# Patient Record
Sex: Female | Born: 1985
Health system: Southern US, Community
[De-identification: ages and names within clinical notes are randomized; demographics above are authoritative.]

## PROBLEM LIST (undated history)

## (undated) DIAGNOSIS — Z789 Other specified health status: Secondary | ICD-10-CM

## (undated) HISTORY — PX: OVARIAN CYST DRAINAGE: SHX325

## (undated) HISTORY — PX: OTHER SURGICAL HISTORY: SHX169

---
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
## 2005-02-01 ENCOUNTER — Emergency Department: Payer: Self-pay | Admitting: Emergency Medicine

## 2005-02-02 ENCOUNTER — Ambulatory Visit: Payer: Self-pay | Admitting: Emergency Medicine

## 2005-02-08 ENCOUNTER — Emergency Department: Payer: Self-pay | Admitting: Emergency Medicine

## 2005-02-09 ENCOUNTER — Emergency Department (HOSPITAL_COMMUNITY): Admission: EM | Admit: 2005-02-09 | Discharge: 2005-02-10 | Payer: Self-pay | Admitting: Emergency Medicine

## 2005-02-12 ENCOUNTER — Emergency Department (HOSPITAL_COMMUNITY): Admission: EM | Admit: 2005-02-12 | Discharge: 2005-02-12 | Payer: Self-pay | Admitting: *Deleted

## 2005-03-06 ENCOUNTER — Emergency Department (HOSPITAL_COMMUNITY): Admission: EM | Admit: 2005-03-06 | Discharge: 2005-03-06 | Payer: Self-pay | Admitting: *Deleted

## 2005-03-10 ENCOUNTER — Emergency Department: Payer: Self-pay | Admitting: Emergency Medicine

## 2005-08-05 ENCOUNTER — Emergency Department: Payer: Self-pay | Admitting: Emergency Medicine

## 2005-08-05 ENCOUNTER — Emergency Department (HOSPITAL_COMMUNITY): Admission: EM | Admit: 2005-08-05 | Discharge: 2005-08-05 | Payer: Self-pay | Admitting: Emergency Medicine

## 2005-08-24 ENCOUNTER — Emergency Department (HOSPITAL_COMMUNITY): Admission: EM | Admit: 2005-08-24 | Discharge: 2005-08-25 | Payer: Self-pay | Admitting: Emergency Medicine

## 2005-08-26 ENCOUNTER — Emergency Department (HOSPITAL_COMMUNITY): Admission: EM | Admit: 2005-08-26 | Discharge: 2005-08-26 | Payer: Self-pay | Admitting: Emergency Medicine

## 2005-09-02 ENCOUNTER — Emergency Department (HOSPITAL_COMMUNITY): Admission: EM | Admit: 2005-09-02 | Discharge: 2005-09-02 | Payer: Self-pay | Admitting: Emergency Medicine

## 2005-09-08 ENCOUNTER — Emergency Department (HOSPITAL_COMMUNITY): Admission: AD | Admit: 2005-09-08 | Discharge: 2005-09-08 | Payer: Self-pay | Admitting: Family Medicine

## 2005-09-14 ENCOUNTER — Emergency Department (HOSPITAL_COMMUNITY): Admission: EM | Admit: 2005-09-14 | Discharge: 2005-09-14 | Payer: Self-pay | Admitting: Emergency Medicine

## 2005-10-12 ENCOUNTER — Emergency Department: Payer: Self-pay | Admitting: Emergency Medicine

## 2006-03-09 ENCOUNTER — Emergency Department (HOSPITAL_COMMUNITY): Admission: EM | Admit: 2006-03-09 | Discharge: 2006-03-10 | Payer: Self-pay | Admitting: Emergency Medicine

## 2006-06-15 ENCOUNTER — Emergency Department: Payer: Self-pay | Admitting: Emergency Medicine

## 2006-07-20 ENCOUNTER — Emergency Department (HOSPITAL_COMMUNITY): Admission: EM | Admit: 2006-07-20 | Discharge: 2006-07-20 | Payer: Self-pay | Admitting: Emergency Medicine

## 2006-08-16 ENCOUNTER — Emergency Department (HOSPITAL_COMMUNITY): Admission: EM | Admit: 2006-08-16 | Discharge: 2006-08-16 | Payer: Self-pay | Admitting: Emergency Medicine

## 2006-09-07 ENCOUNTER — Emergency Department (HOSPITAL_COMMUNITY): Admission: EM | Admit: 2006-09-07 | Discharge: 2006-09-07 | Payer: Self-pay | Admitting: Emergency Medicine

## 2006-09-22 ENCOUNTER — Inpatient Hospital Stay (HOSPITAL_COMMUNITY): Admission: EM | Admit: 2006-09-22 | Discharge: 2006-09-23 | Payer: Self-pay | Admitting: Emergency Medicine

## 2006-12-02 ENCOUNTER — Emergency Department (HOSPITAL_COMMUNITY): Admission: EM | Admit: 2006-12-02 | Discharge: 2006-12-02 | Payer: Self-pay | Admitting: Emergency Medicine

## 2007-01-05 ENCOUNTER — Emergency Department (HOSPITAL_COMMUNITY): Admission: EM | Admit: 2007-01-05 | Discharge: 2007-01-05 | Payer: Self-pay | Admitting: *Deleted

## 2007-02-24 ENCOUNTER — Emergency Department (HOSPITAL_COMMUNITY): Admission: EM | Admit: 2007-02-24 | Discharge: 2007-02-24 | Payer: Self-pay | Admitting: Emergency Medicine

## 2007-03-20 ENCOUNTER — Emergency Department (HOSPITAL_COMMUNITY): Admission: EM | Admit: 2007-03-20 | Discharge: 2007-03-20 | Payer: Self-pay | Admitting: Emergency Medicine

## 2007-04-22 ENCOUNTER — Emergency Department (HOSPITAL_COMMUNITY): Admission: EM | Admit: 2007-04-22 | Discharge: 2007-04-22 | Payer: Self-pay | Admitting: Emergency Medicine

## 2007-07-02 ENCOUNTER — Emergency Department: Payer: Self-pay | Admitting: Internal Medicine

## 2007-08-21 ENCOUNTER — Emergency Department: Payer: Self-pay | Admitting: Emergency Medicine

## 2007-10-08 ENCOUNTER — Emergency Department (HOSPITAL_COMMUNITY): Admission: EM | Admit: 2007-10-08 | Discharge: 2007-10-08 | Payer: Self-pay | Admitting: Emergency Medicine

## 2007-10-19 ENCOUNTER — Emergency Department: Payer: Self-pay | Admitting: Emergency Medicine

## 2007-12-09 ENCOUNTER — Other Ambulatory Visit: Payer: Self-pay

## 2007-12-09 ENCOUNTER — Observation Stay: Payer: Self-pay | Admitting: Internal Medicine

## 2008-02-23 ENCOUNTER — Emergency Department: Payer: Self-pay | Admitting: Emergency Medicine

## 2008-05-10 ENCOUNTER — Emergency Department (HOSPITAL_COMMUNITY): Admission: EM | Admit: 2008-05-10 | Discharge: 2008-05-10 | Payer: Self-pay | Admitting: Emergency Medicine

## 2008-06-17 ENCOUNTER — Emergency Department (HOSPITAL_COMMUNITY): Admission: EM | Admit: 2008-06-17 | Discharge: 2008-06-18 | Payer: Self-pay | Admitting: Emergency Medicine

## 2008-08-17 ENCOUNTER — Emergency Department (HOSPITAL_COMMUNITY): Admission: EM | Admit: 2008-08-17 | Discharge: 2008-08-17 | Payer: Self-pay | Admitting: Emergency Medicine

## 2008-08-26 ENCOUNTER — Ambulatory Visit (HOSPITAL_COMMUNITY): Admission: RE | Admit: 2008-08-26 | Discharge: 2008-08-26 | Payer: Self-pay | Admitting: Orthopedic Surgery

## 2008-09-29 ENCOUNTER — Emergency Department (HOSPITAL_COMMUNITY): Admission: EM | Admit: 2008-09-29 | Discharge: 2008-09-29 | Payer: Self-pay | Admitting: Emergency Medicine

## 2008-10-09 ENCOUNTER — Emergency Department (HOSPITAL_COMMUNITY): Admission: EM | Admit: 2008-10-09 | Discharge: 2008-10-09 | Payer: Self-pay | Admitting: Emergency Medicine

## 2008-12-13 ENCOUNTER — Emergency Department (HOSPITAL_COMMUNITY): Admission: EM | Admit: 2008-12-13 | Discharge: 2008-12-13 | Payer: Self-pay | Admitting: Emergency Medicine

## 2009-02-16 ENCOUNTER — Emergency Department (HOSPITAL_COMMUNITY): Admission: EM | Admit: 2009-02-16 | Discharge: 2009-02-16 | Payer: Self-pay | Admitting: Emergency Medicine

## 2009-02-21 ENCOUNTER — Emergency Department (HOSPITAL_COMMUNITY): Admission: EM | Admit: 2009-02-21 | Discharge: 2009-02-22 | Payer: Self-pay | Admitting: Emergency Medicine

## 2009-02-27 ENCOUNTER — Emergency Department: Payer: Self-pay | Admitting: Emergency Medicine

## 2009-03-04 ENCOUNTER — Emergency Department: Payer: Self-pay | Admitting: Unknown Physician Specialty

## 2009-03-08 ENCOUNTER — Emergency Department (HOSPITAL_COMMUNITY): Admission: EM | Admit: 2009-03-08 | Discharge: 2009-03-08 | Payer: Self-pay | Admitting: Emergency Medicine

## 2009-03-14 ENCOUNTER — Emergency Department (HOSPITAL_BASED_OUTPATIENT_CLINIC_OR_DEPARTMENT_OTHER): Admission: EM | Admit: 2009-03-14 | Discharge: 2009-03-14 | Payer: Self-pay | Admitting: Emergency Medicine

## 2009-03-14 ENCOUNTER — Ambulatory Visit: Payer: Self-pay | Admitting: Diagnostic Radiology

## 2009-03-22 ENCOUNTER — Ambulatory Visit: Payer: Self-pay | Admitting: Diagnostic Radiology

## 2009-03-22 ENCOUNTER — Emergency Department (HOSPITAL_BASED_OUTPATIENT_CLINIC_OR_DEPARTMENT_OTHER): Admission: EM | Admit: 2009-03-22 | Discharge: 2009-03-22 | Payer: Self-pay | Admitting: Emergency Medicine

## 2009-05-06 ENCOUNTER — Emergency Department (HOSPITAL_COMMUNITY): Admission: EM | Admit: 2009-05-06 | Discharge: 2009-05-06 | Payer: Self-pay | Admitting: Emergency Medicine

## 2009-05-23 ENCOUNTER — Emergency Department (HOSPITAL_COMMUNITY): Admission: EM | Admit: 2009-05-23 | Discharge: 2009-05-23 | Payer: Self-pay | Admitting: Emergency Medicine

## 2009-07-03 ENCOUNTER — Inpatient Hospital Stay (HOSPITAL_COMMUNITY): Admission: AD | Admit: 2009-07-03 | Discharge: 2009-07-03 | Payer: Self-pay | Admitting: Obstetrics & Gynecology

## 2009-07-06 ENCOUNTER — Emergency Department (HOSPITAL_BASED_OUTPATIENT_CLINIC_OR_DEPARTMENT_OTHER): Admission: EM | Admit: 2009-07-06 | Discharge: 2009-07-06 | Payer: Self-pay | Admitting: Emergency Medicine

## 2009-07-12 IMAGING — CR DG TOE GREAT 2+V*R*
3 series · 3 of 3 positions shown · non-contrast
Comparison: 

CLINICAL DATA: Right great toe pain, fell

RIGHT TOE - 2+ VIEW

[t toes ap right]
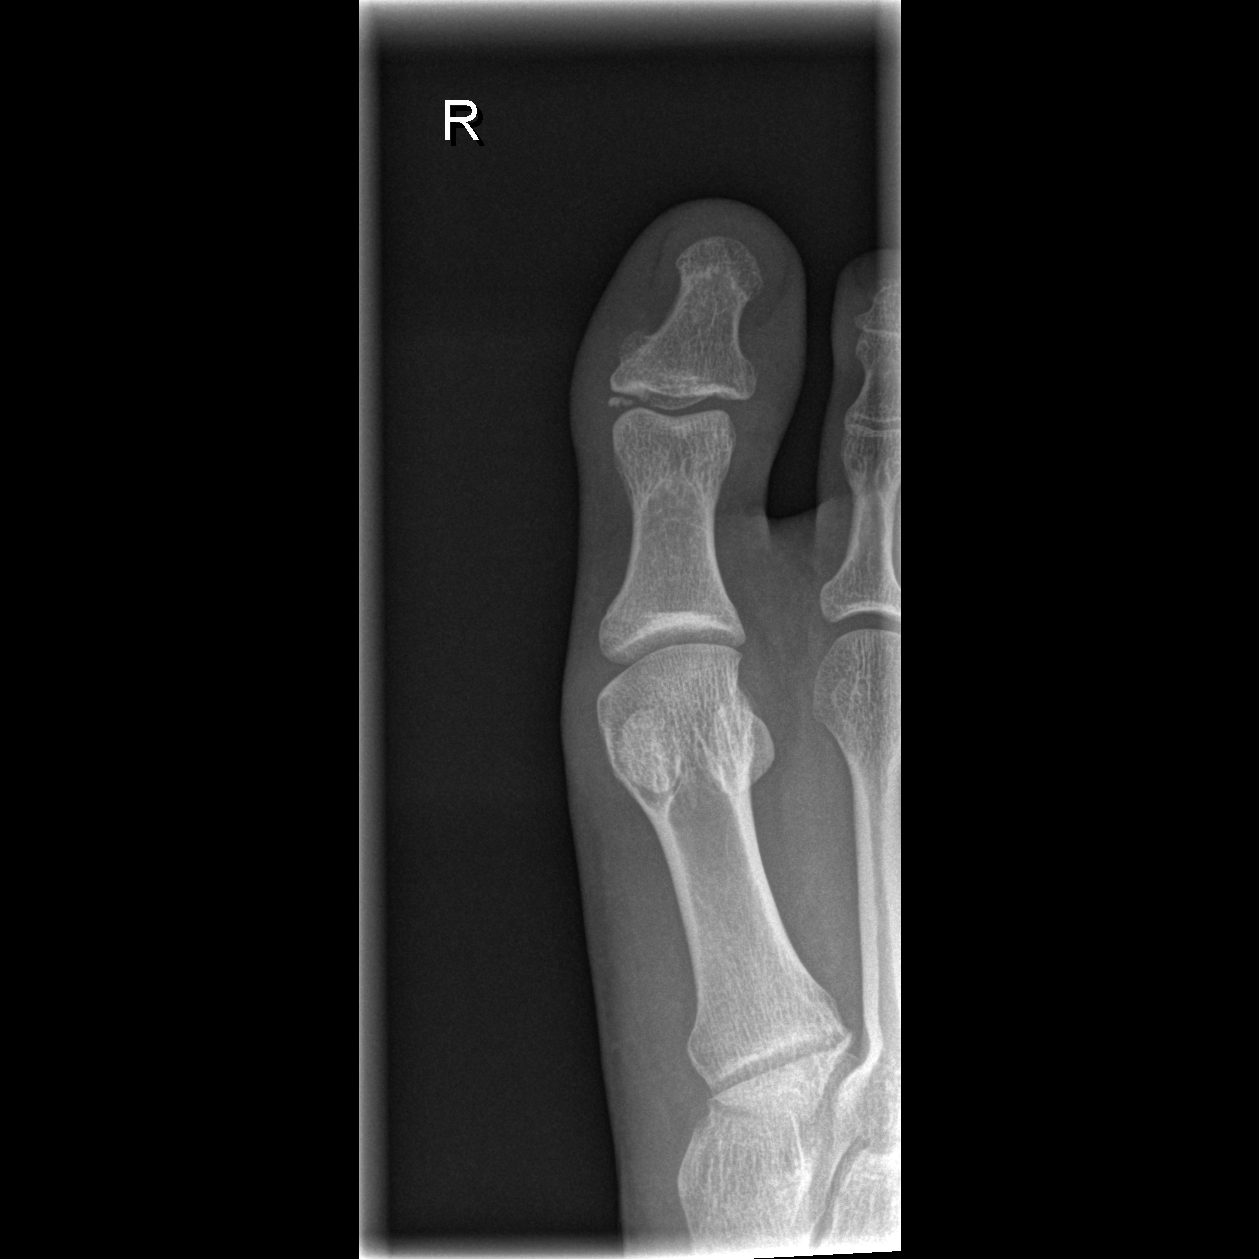

[t toes oblique right]
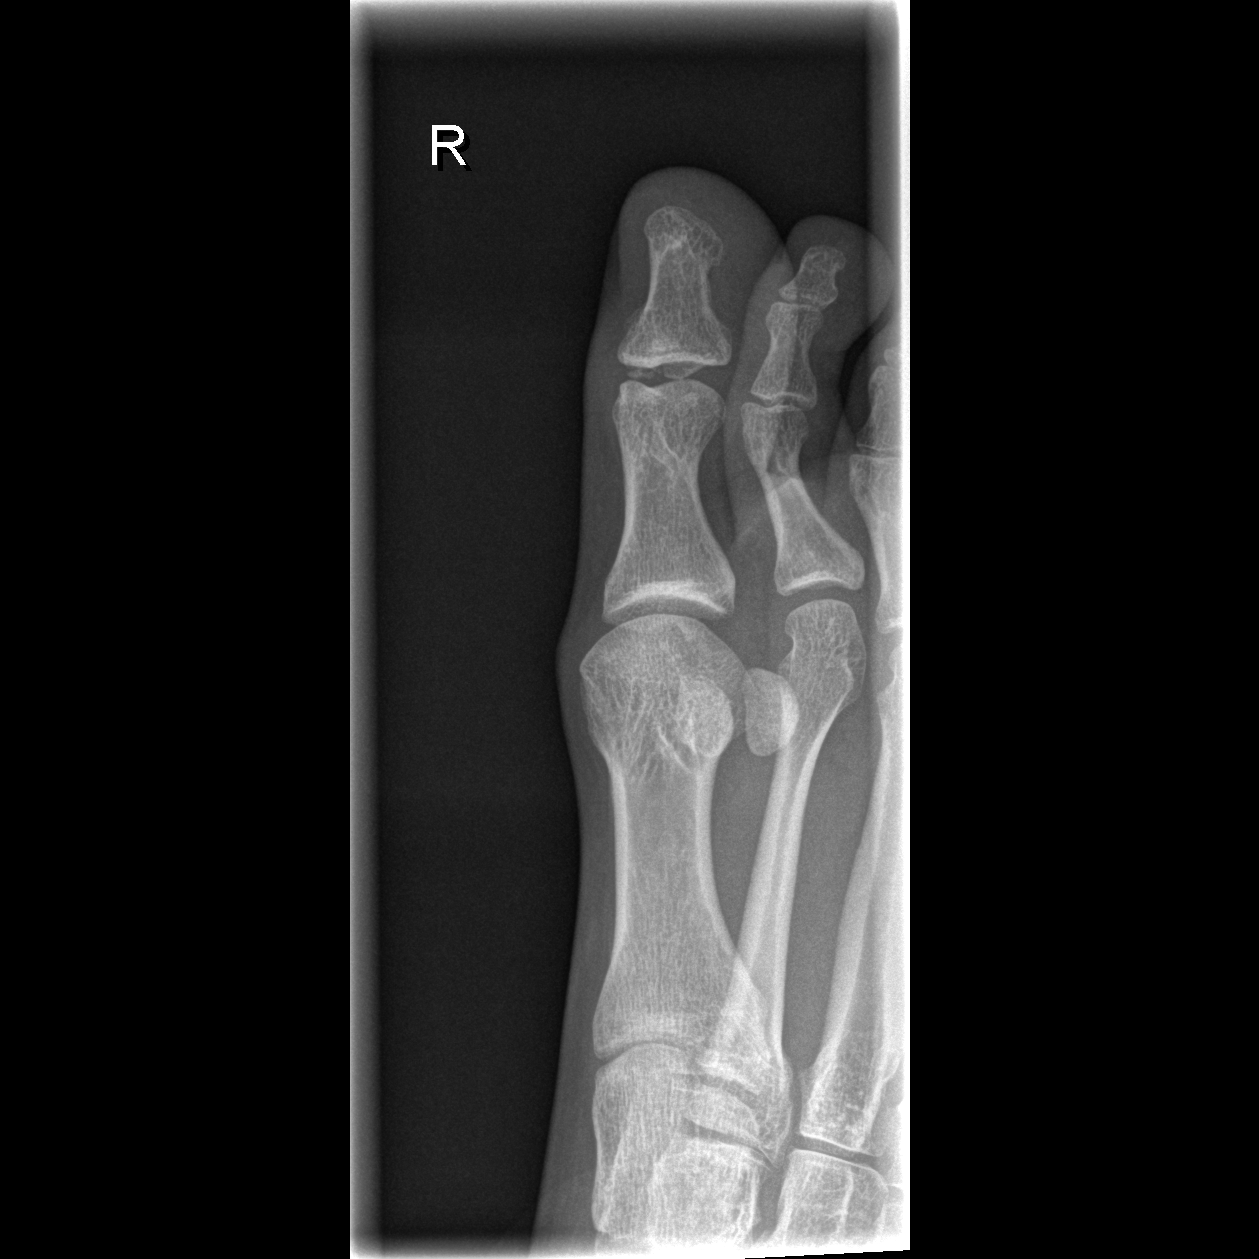

[t toes lateral right]
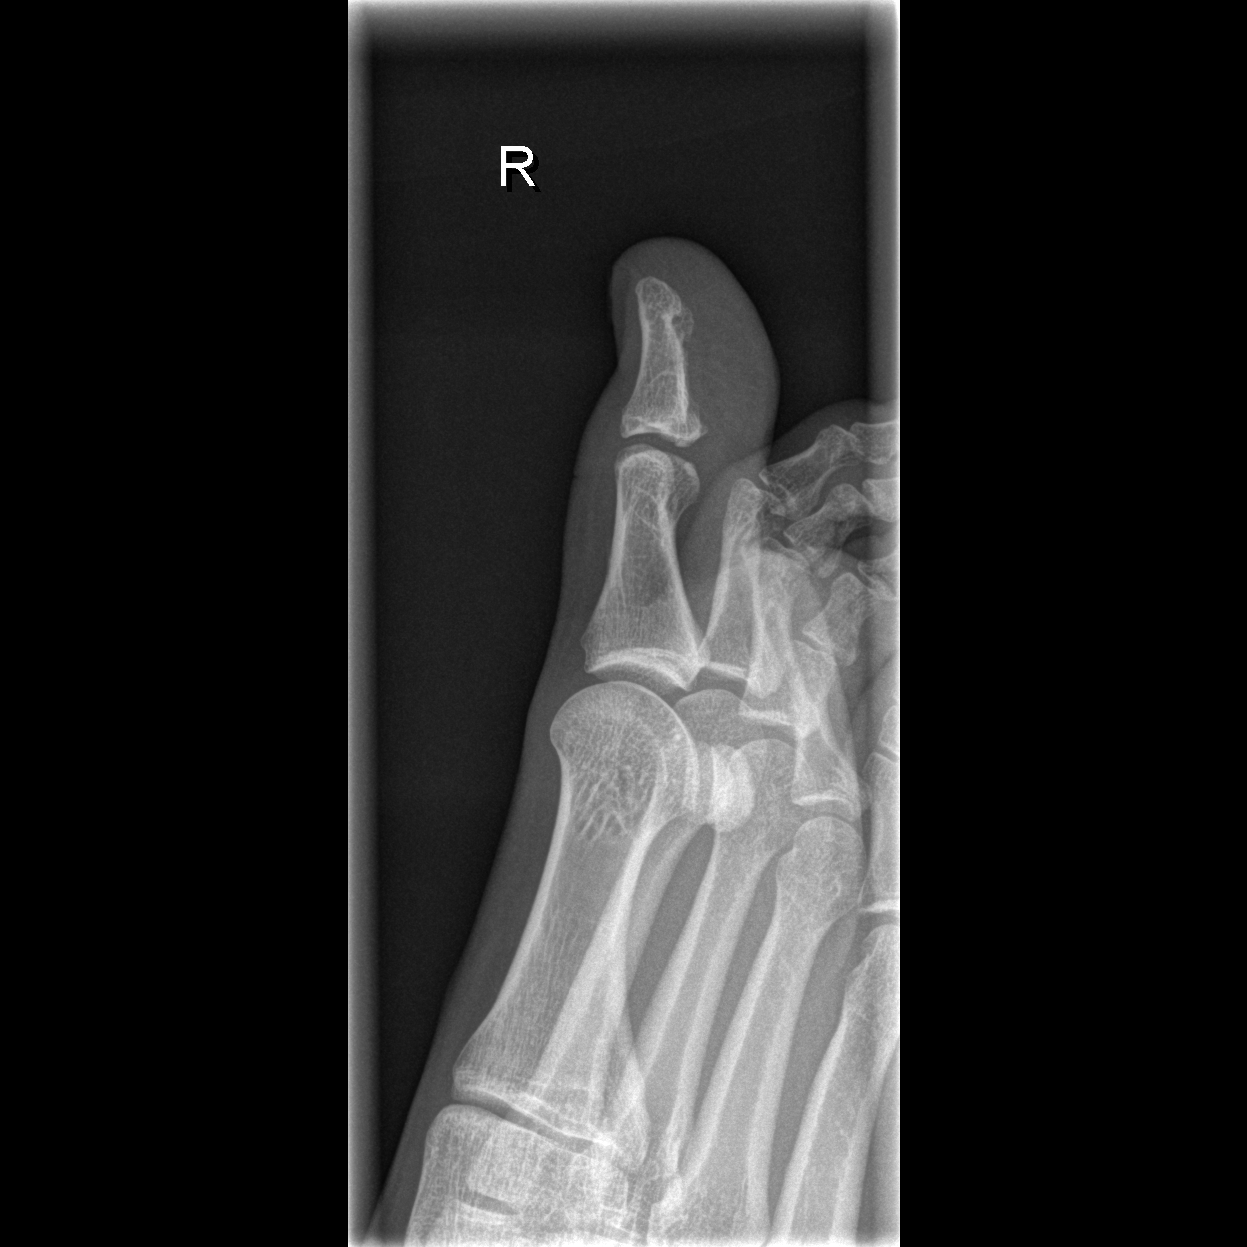

[3 of 3 positions shown; findings below may reference images not displayed]

FINDINGS: Small avulsion off the distal phalanx of the right great
toe laterally.  This projects into the joint space.  Soft tissue
swelling is present.  There is no dislocation.
IMPRESSION: Small avulsion fracture off the distal phalanx of the great toe at
the interphalangeal joint.  This is marked with an arrow.

## 2009-07-12 IMAGING — CR DG CHEST 2V
2 series · 2 of 2 positions shown · non-contrast
Comparison: 09/22/2006

CLINICAL DATA: Chest pain, vomiting, cough

CHEST - 2 VIEW

[w chest pa]
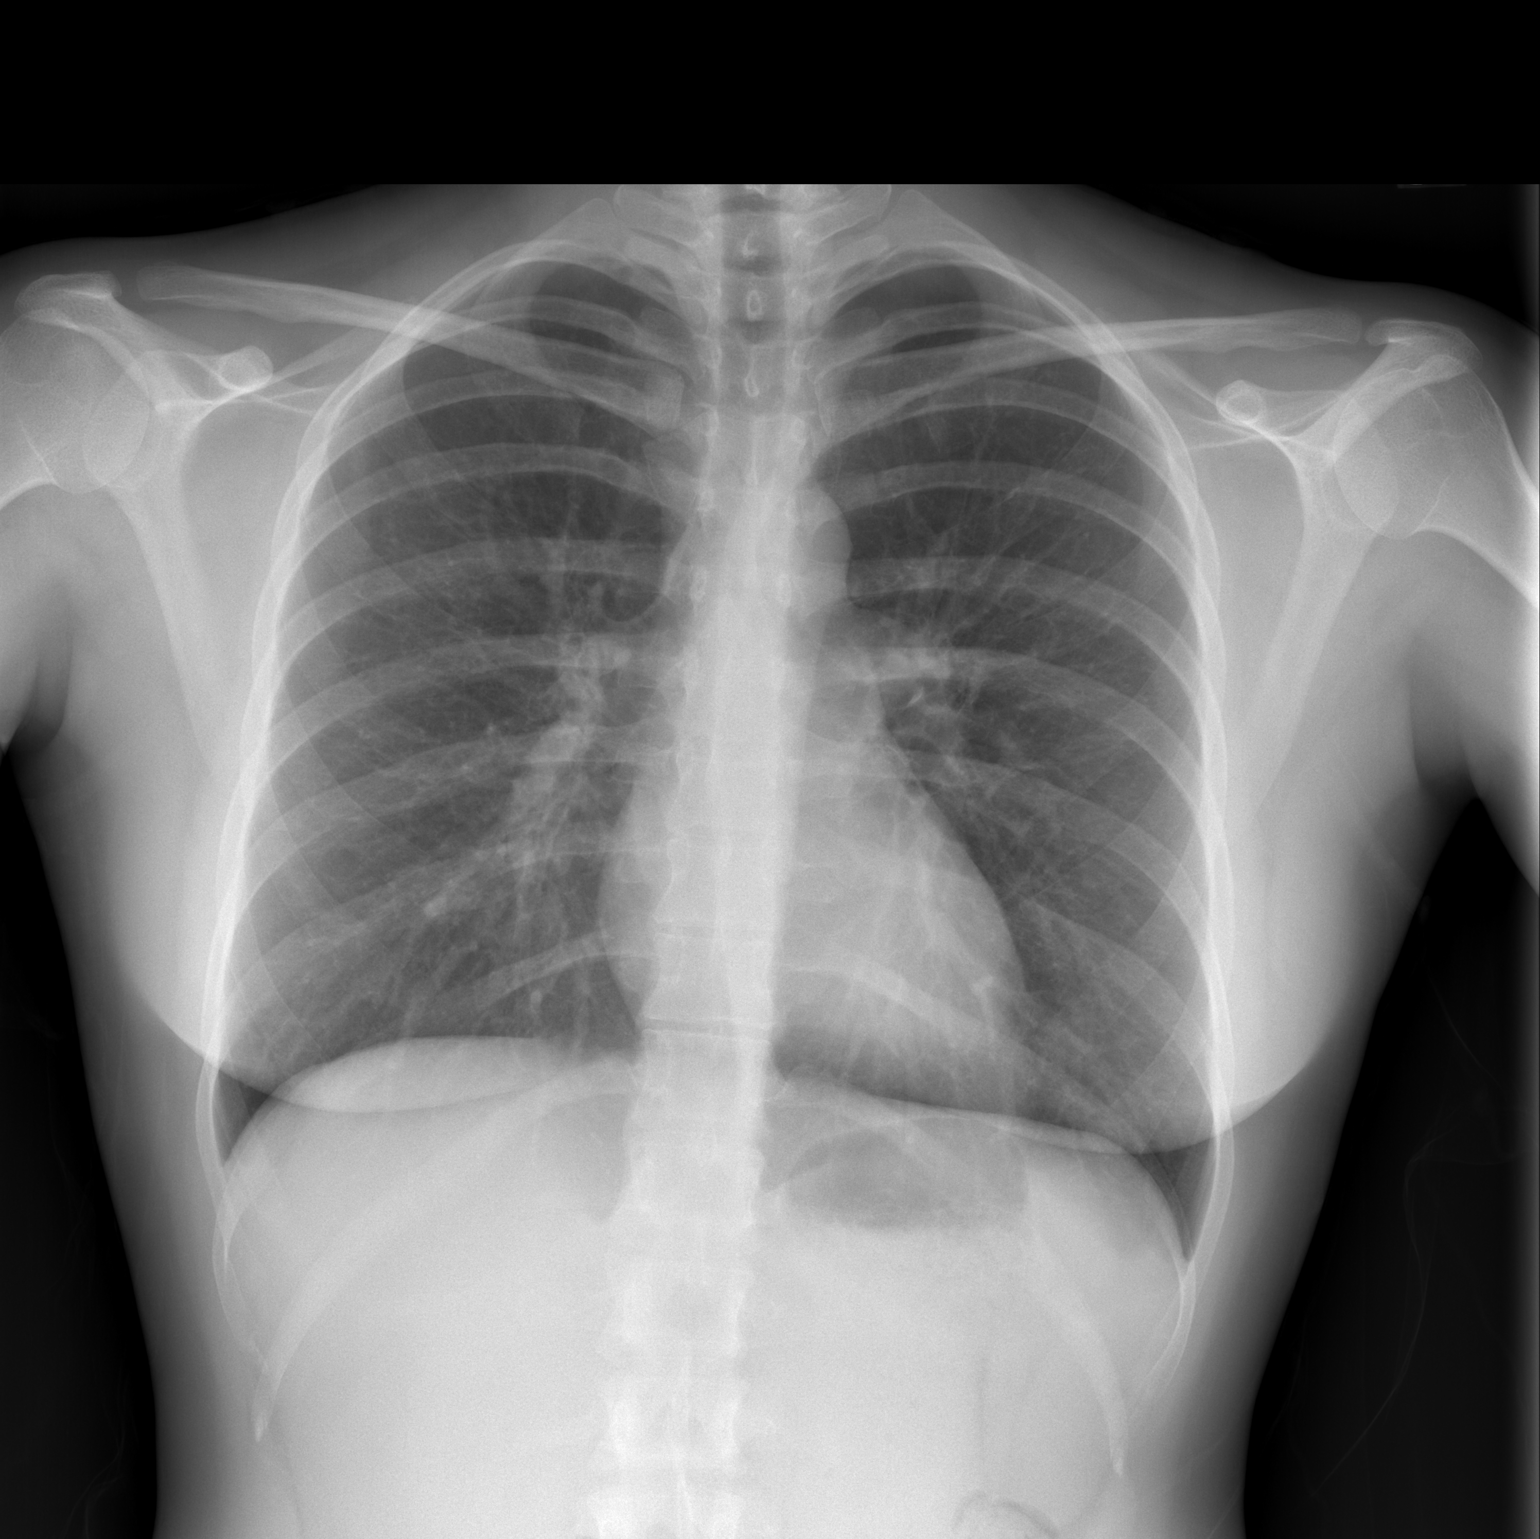

[w chest lat]
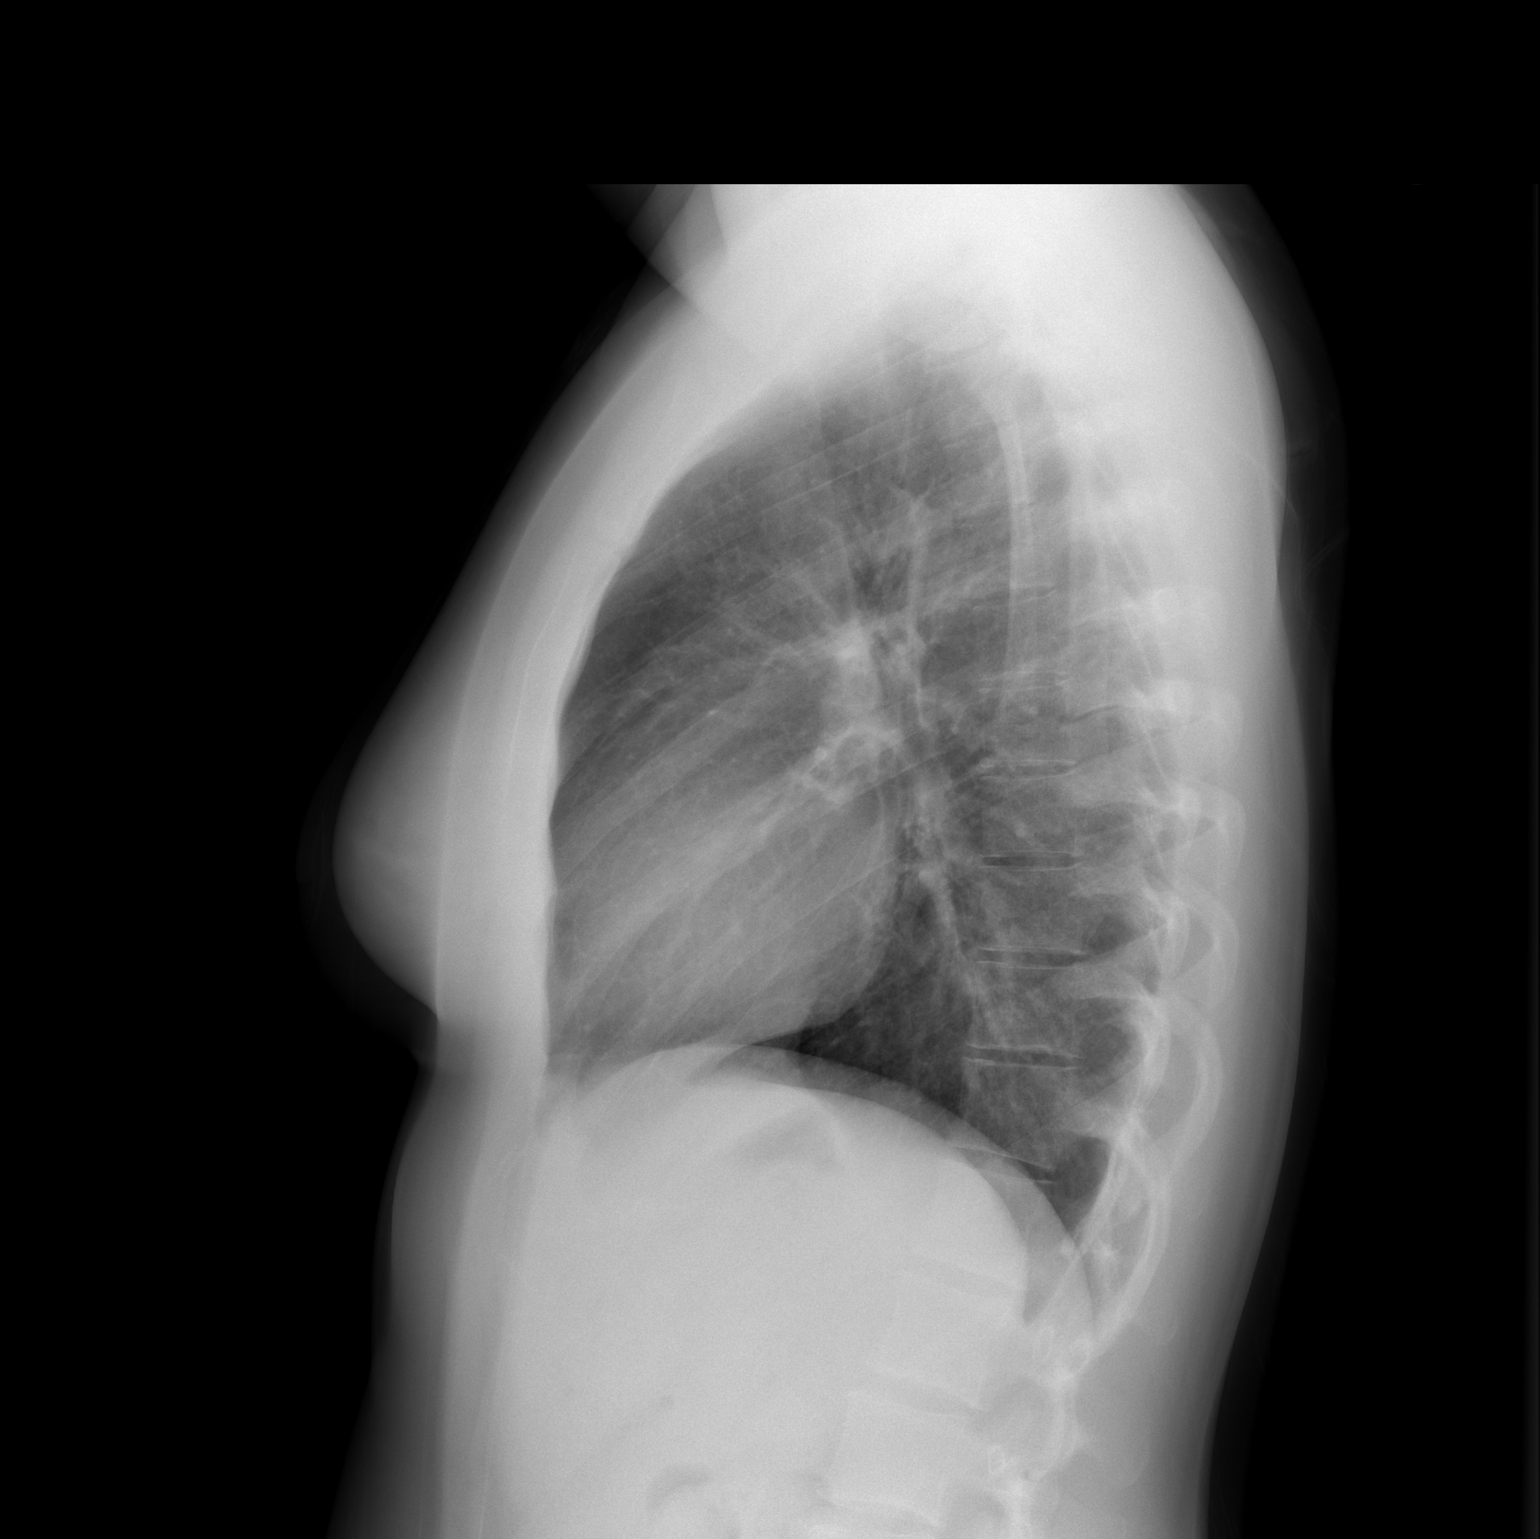

[2 of 2 positions shown; findings below may reference images not displayed]

FINDINGS: The heart size and mediastinal contours are within normal
limits.  Both lungs are clear.  The visualized skeletal structures
are unremarkable. No change from priors.
IMPRESSION: No active cardiopulmonary disease.

## 2010-02-06 ENCOUNTER — Inpatient Hospital Stay (HOSPITAL_COMMUNITY): Admission: AD | Admit: 2010-02-06 | Discharge: 2010-02-06 | Payer: Self-pay | Admitting: Obstetrics and Gynecology

## 2010-02-13 ENCOUNTER — Inpatient Hospital Stay (HOSPITAL_COMMUNITY): Admission: AD | Admit: 2010-02-13 | Discharge: 2010-02-13 | Payer: Self-pay | Admitting: Obstetrics and Gynecology

## 2010-02-27 ENCOUNTER — Inpatient Hospital Stay (HOSPITAL_COMMUNITY): Admission: AD | Admit: 2010-02-27 | Discharge: 2010-03-02 | Payer: Self-pay | Admitting: Obstetrics & Gynecology

## 2010-03-10 ENCOUNTER — Ambulatory Visit (HOSPITAL_COMMUNITY): Admission: RE | Admit: 2010-03-10 | Discharge: 2010-03-10 | Payer: Self-pay | Admitting: Obstetrics & Gynecology

## 2010-03-20 ENCOUNTER — Emergency Department (HOSPITAL_COMMUNITY): Admission: EM | Admit: 2010-03-20 | Discharge: 2010-03-20 | Payer: Self-pay | Admitting: Emergency Medicine

## 2010-07-20 ENCOUNTER — Emergency Department (HOSPITAL_COMMUNITY)
Admission: EM | Admit: 2010-07-20 | Discharge: 2010-07-20 | Payer: Self-pay | Source: Home / Self Care | Admitting: Emergency Medicine

## 2010-08-12 ENCOUNTER — Emergency Department (HOSPITAL_COMMUNITY): Admission: EM | Admit: 2010-08-12 | Discharge: 2010-08-12 | Payer: Self-pay | Admitting: Family Medicine

## 2010-10-09 ENCOUNTER — Emergency Department (HOSPITAL_COMMUNITY)
Admission: EM | Admit: 2010-10-09 | Discharge: 2010-10-09 | Payer: Self-pay | Source: Home / Self Care | Admitting: Emergency Medicine

## 2010-11-06 ENCOUNTER — Emergency Department (HOSPITAL_COMMUNITY)
Admission: EM | Admit: 2010-11-06 | Discharge: 2010-11-06 | Payer: Self-pay | Source: Home / Self Care | Admitting: Family Medicine

## 2010-11-15 ENCOUNTER — Emergency Department (HOSPITAL_COMMUNITY)
Admission: EM | Admit: 2010-11-15 | Discharge: 2010-11-15 | Payer: Self-pay | Source: Home / Self Care | Admitting: Emergency Medicine

## 2010-11-20 LAB — POCT I-STAT, CHEM 8
BUN: 7 mg/dL (ref 6–23)
Creatinine, Ser: 0.8 mg/dL (ref 0.4–1.2)
HCT: 40 % (ref 36.0–46.0)
Hemoglobin: 13.6 g/dL (ref 12.0–15.0)
TCO2: 27 mmol/L (ref 0–100)

## 2010-11-20 LAB — POCT PREGNANCY, URINE: Preg Test, Ur: NEGATIVE

## 2010-11-20 LAB — URINALYSIS, ROUTINE W REFLEX MICROSCOPIC
Specific Gravity, Urine: 1.029 (ref 1.005–1.030)
Urine Glucose, Fasting: NEGATIVE mg/dL
Urobilinogen, UA: 0.2 mg/dL (ref 0.0–1.0)
pH: 6 (ref 5.0–8.0)

## 2010-12-25 ENCOUNTER — Ambulatory Visit (INDEPENDENT_AMBULATORY_CARE_PROVIDER_SITE_OTHER): Payer: Medicaid Other

## 2010-12-25 ENCOUNTER — Inpatient Hospital Stay (INDEPENDENT_AMBULATORY_CARE_PROVIDER_SITE_OTHER)
Admission: RE | Admit: 2010-12-25 | Discharge: 2010-12-25 | Disposition: A | Discharge status: Home / Self Care | Payer: Medicaid Other | Source: Ambulatory Visit | Attending: Emergency Medicine | Admitting: Emergency Medicine

## 2010-12-25 DIAGNOSIS — J40 Bronchitis, not specified as acute or chronic: Secondary | ICD-10-CM

## 2010-12-25 DIAGNOSIS — R071 Chest pain on breathing: Secondary | ICD-10-CM

## 2011-01-05 ENCOUNTER — Inpatient Hospital Stay (INDEPENDENT_AMBULATORY_CARE_PROVIDER_SITE_OTHER)
Admission: RE | Admit: 2011-01-05 | Discharge: 2011-01-05 | Disposition: A | Discharge status: Home / Self Care | Payer: Worker's Compensation | Source: Ambulatory Visit | Attending: Emergency Medicine | Admitting: Emergency Medicine

## 2011-01-05 ENCOUNTER — Ambulatory Visit (INDEPENDENT_AMBULATORY_CARE_PROVIDER_SITE_OTHER): Payer: Medicaid Other

## 2011-01-05 DIAGNOSIS — S7000XA Contusion of unspecified hip, initial encounter: Secondary | ICD-10-CM

## 2011-01-05 LAB — POCT PREGNANCY, URINE: Preg Test, Ur: NEGATIVE

## 2011-01-10 LAB — COMPREHENSIVE METABOLIC PANEL
Albumin: 4.4 g/dL (ref 3.5–5.2)
BUN: 4 mg/dL — ABNORMAL LOW (ref 6–23)
Calcium: 9.7 mg/dL (ref 8.4–10.5)
Creatinine, Ser: 0.79 mg/dL (ref 0.4–1.2)
Potassium: 3.6 mEq/L (ref 3.5–5.1)
Total Protein: 8 g/dL (ref 6.0–8.3)

## 2011-01-10 LAB — CBC
MCH: 29.9 pg (ref 26.0–34.0)
MCHC: 33.1 g/dL (ref 30.0–36.0)
MCV: 90.4 fL (ref 78.0–100.0)
Platelets: 207 10*3/uL (ref 150–400)
RDW: 14.1 % (ref 11.5–15.5)
WBC: 9.4 10*3/uL (ref 4.0–10.5)

## 2011-01-10 LAB — DIFFERENTIAL
Lymphocytes Relative: 14 % (ref 12–46)
Lymphs Abs: 1.3 10*3/uL (ref 0.7–4.0)
Monocytes Absolute: 0.6 10*3/uL (ref 0.1–1.0)
Monocytes Relative: 6 % (ref 3–12)
Neutro Abs: 7.3 10*3/uL (ref 1.7–7.7)
Neutrophils Relative %: 78 % — ABNORMAL HIGH (ref 43–77)

## 2011-01-15 LAB — SEDIMENTATION RATE: Sed Rate: 42 mm/hr — ABNORMAL HIGH (ref 0–22)

## 2011-01-15 LAB — BASIC METABOLIC PANEL
BUN: 7 mg/dL (ref 6–23)
CO2: 26 mEq/L (ref 19–32)
Calcium: 9.8 mg/dL (ref 8.4–10.5)
Chloride: 107 mEq/L (ref 96–112)
Creatinine, Ser: 0.74 mg/dL (ref 0.4–1.2)
GFR calc Af Amer: >60 mL/min (ref >60)

## 2011-01-15 LAB — CBC
MCHC: 33.5 g/dL (ref 30.0–36.0)
MCV: 92.3 fL (ref 78.0–100.0)
Platelets: 290 10*3/uL (ref 150–400)
RBC: 3.65 MIL/uL — ABNORMAL LOW (ref 3.87–5.11)

## 2011-01-15 LAB — DIFFERENTIAL
Basophils Absolute: 0.1 10*3/uL (ref 0.0–0.1)
Basophils Relative: 1 % (ref 0–1)
Eosinophils Absolute: 0.1 10*3/uL (ref 0.0–0.7)
Monocytes Relative: 6 % (ref 3–12)
Neutro Abs: 3.7 10*3/uL (ref 1.7–7.7)
Neutrophils Relative %: 63 % (ref 43–77)

## 2011-01-15 LAB — URINALYSIS, ROUTINE W REFLEX MICROSCOPIC
Glucose, UA: NEGATIVE mg/dL
Specific Gravity, Urine: 1.01 (ref 1.005–1.030)

## 2011-01-15 LAB — URINE MICROSCOPIC-ADD ON

## 2011-01-16 LAB — CBC
HCT: 22.7 % — ABNORMAL LOW (ref 36.0–46.0)
HCT: 28.1 % — ABNORMAL LOW (ref 36.0–46.0)
Hemoglobin: 8 g/dL — ABNORMAL LOW (ref 12.0–15.0)
Platelets: 157 10*3/uL (ref 150–400)
Platelets: 351 10*3/uL (ref 150–400)
RBC: 2.38 MIL/uL — ABNORMAL LOW (ref 3.87–5.11)
RBC: 3.42 MIL/uL — ABNORMAL LOW (ref 3.87–5.11)
RDW: 14 % (ref 11.5–15.5)
RDW: 14.5 % (ref 11.5–15.5)
WBC: 12.5 10*3/uL — ABNORMAL HIGH (ref 4.0–10.5)
WBC: 14.5 10*3/uL — ABNORMAL HIGH (ref 4.0–10.5)
WBC: 8.4 10*3/uL (ref 4.0–10.5)

## 2011-01-16 LAB — RH IMMUNE GLOB WKUP(>/=20WKS)(NOT WOMEN'S HOSP): Fetal Screen: NEGATIVE

## 2011-01-16 LAB — RPR: RPR Ser Ql: NONREACTIVE

## 2011-02-02 LAB — URINE MICROSCOPIC-ADD ON

## 2011-02-02 LAB — URINE CULTURE
Colony Count: NO GROWTH
Culture: NO GROWTH

## 2011-02-02 LAB — CBC
HCT: 30.6 % — ABNORMAL LOW (ref 36.0–46.0)
Platelets: 163 10*3/uL (ref 150–400)
RBC: 3.22 MIL/uL — ABNORMAL LOW (ref 3.87–5.11)
WBC: 10.3 10*3/uL (ref 4.0–10.5)

## 2011-02-02 LAB — URINALYSIS, ROUTINE W REFLEX MICROSCOPIC
Bilirubin Urine: NEGATIVE
Glucose, UA: NEGATIVE mg/dL
Glucose, UA: NEGATIVE mg/dL
Ketones, ur: NEGATIVE mg/dL
Leukocytes, UA: NEGATIVE
Nitrite: NEGATIVE
Protein, ur: NEGATIVE mg/dL
Specific Gravity, Urine: 1.025 (ref 1.005–1.030)
pH: 6 (ref 5.0–8.0)

## 2011-02-02 LAB — BASIC METABOLIC PANEL
BUN: 7 mg/dL (ref 6–23)
Chloride: 109 mEq/L (ref 96–112)
GFR calc Af Amer: >60 mL/min (ref >60)
GFR calc non Af Amer: >60 mL/min (ref >60)
Potassium: 3.4 mEq/L — ABNORMAL LOW (ref 3.5–5.1)

## 2011-02-02 LAB — WET PREP, GENITAL: Clue Cells Wet Prep HPF POC: NONE SEEN

## 2011-02-04 LAB — COMPREHENSIVE METABOLIC PANEL
BUN: 9 mg/dL (ref 6–23)
CO2: 26 mEq/L (ref 19–32)
Calcium: 9.4 mg/dL (ref 8.4–10.5)
Chloride: 107 mEq/L (ref 96–112)
Creatinine, Ser: 0.83 mg/dL (ref 0.4–1.2)
GFR calc non Af Amer: >60 mL/min (ref >60)
Total Bilirubin: 0.1 mg/dL — ABNORMAL LOW (ref 0.3–1.2)

## 2011-02-04 LAB — DIFFERENTIAL
Basophils Absolute: 0.1 10*3/uL (ref 0.0–0.1)
Basophils Relative: 1 % (ref 0–1)
Eosinophils Absolute: 0.1 10*3/uL (ref 0.0–0.7)
Eosinophils Relative: 1 % (ref 0–5)
Lymphocytes Relative: 38 % (ref 12–46)
Lymphs Abs: 2.1 10*3/uL (ref 0.7–4.0)
Monocytes Relative: 6 % (ref 3–12)
Neutro Abs: 3.5 10*3/uL (ref 1.7–7.7)
Neutrophils Relative %: 62 % (ref 43–77)

## 2011-02-04 LAB — CBC
HCT: 39.5 % (ref 36.0–46.0)
HCT: 38.2 % (ref 36.0–46.0)
MCHC: 33.8 g/dL (ref 30.0–36.0)
MCHC: 33.5 g/dL (ref 30.0–36.0)
MCV: 93 fL (ref 78.0–100.0)
MCV: 93 fL (ref 78.0–100.0)
Platelets: 163 10*3/uL (ref 150–400)
RBC: 4.25 MIL/uL (ref 3.87–5.11)
RBC: 4.1 MIL/uL (ref 3.87–5.11)
WBC: 7.3 10*3/uL (ref 4.0–10.5)
WBC: 6.5 10*3/uL (ref 4.0–10.5)

## 2011-02-04 LAB — URINE MICROSCOPIC-ADD ON

## 2011-02-04 LAB — URINALYSIS, ROUTINE W REFLEX MICROSCOPIC
Bilirubin Urine: NEGATIVE
Glucose, UA: NEGATIVE mg/dL
Ketones, ur: NEGATIVE mg/dL
Leukocytes, UA: NEGATIVE
Nitrite: NEGATIVE
Protein, ur: NEGATIVE mg/dL
Specific Gravity, Urine: 1.019 (ref 1.005–1.030)
Urobilinogen, UA: 0.2 mg/dL (ref 0.0–1.0)
pH: 7 (ref 5.0–8.0)
pH: 7 (ref 5.0–8.0)

## 2011-02-04 LAB — APTT: aPTT: 29 seconds (ref 24–37)

## 2011-02-04 LAB — BASIC METABOLIC PANEL
BUN: 10 mg/dL (ref 6–23)
CO2: 25 mEq/L (ref 19–32)
Chloride: 111 mEq/L (ref 96–112)
Creatinine, Ser: 0.7 mg/dL (ref 0.4–1.2)
GFR calc Af Amer: >60 mL/min (ref >60)
Potassium: 3.9 mEq/L (ref 3.5–5.1)

## 2011-02-04 LAB — RAPID URINE DRUG SCREEN, HOSP PERFORMED
Amphetamines: NOT DETECTED
Amphetamines: NOT DETECTED
Barbiturates: NOT DETECTED
Benzodiazepines: POSITIVE — AB
Cocaine: POSITIVE — AB
Opiates: NOT DETECTED
Opiates: NOT DETECTED
Tetrahydrocannabinol: NOT DETECTED

## 2011-02-04 LAB — PROTIME-INR
INR: 1.1 (ref 0.00–1.49)
Prothrombin Time: 14.6 seconds (ref 11.6–15.2)

## 2011-02-04 LAB — GLUCOSE, CAPILLARY: Glucose-Capillary: 83 mg/dL (ref 70–99)

## 2011-02-06 LAB — URINE CULTURE: Colony Count: NO GROWTH

## 2011-02-06 LAB — DIFFERENTIAL
Basophils Relative: 1 % (ref 0–1)
Eosinophils Absolute: 0.5 10*3/uL (ref 0.0–0.7)
Eosinophils Relative: 9 % — ABNORMAL HIGH (ref 0–5)
Lymphs Abs: 2.3 10*3/uL (ref 0.7–4.0)
Neutrophils Relative %: 42 % — ABNORMAL LOW (ref 43–77)

## 2011-02-06 LAB — CBC
HCT: 34.3 % — ABNORMAL LOW (ref 36.0–46.0)
MCHC: 34.5 g/dL (ref 30.0–36.0)
MCV: 92.1 fL (ref 78.0–100.0)
Platelets: 153 10*3/uL (ref 150–400)

## 2011-02-06 LAB — WET PREP, GENITAL

## 2011-02-06 LAB — URINALYSIS, ROUTINE W REFLEX MICROSCOPIC
Bilirubin Urine: NEGATIVE
Bilirubin Urine: NEGATIVE
Glucose, UA: NEGATIVE mg/dL
Hgb urine dipstick: NEGATIVE
Ketones, ur: 15 mg/dL — AB
Nitrite: NEGATIVE
Nitrite: NEGATIVE
Nitrite: NEGATIVE
Specific Gravity, Urine: 1.034 — ABNORMAL HIGH (ref 1.005–1.030)
Specific Gravity, Urine: 1.031 — ABNORMAL HIGH (ref 1.005–1.030)
Urobilinogen, UA: 0.2 mg/dL (ref 0.0–1.0)
pH: 6.5 (ref 5.0–8.0)
pH: 6.5 (ref 5.0–8.0)
pH: 6 (ref 5.0–8.0)

## 2011-02-06 LAB — PREGNANCY, URINE: Preg Test, Ur: NEGATIVE

## 2011-02-06 LAB — GC/CHLAMYDIA PROBE AMP, GENITAL
Chlamydia, DNA Probe: POSITIVE — AB
GC Probe Amp, Genital: NEGATIVE

## 2011-02-06 LAB — URINE MICROSCOPIC-ADD ON

## 2011-02-06 LAB — BASIC METABOLIC PANEL
BUN: 11 mg/dL (ref 6–23)
Chloride: 108 mEq/L (ref 96–112)
GFR calc non Af Amer: >60 mL/min (ref >60)
Glucose, Bld: 69 mg/dL — ABNORMAL LOW (ref 70–99)
Potassium: 5 mEq/L (ref 3.5–5.1)

## 2011-02-25 ENCOUNTER — Emergency Department (HOSPITAL_COMMUNITY)
Admission: EM | Admit: 2011-02-25 | Discharge: 2011-02-25 | Disposition: A | Discharge status: Home / Self Care | Payer: Medicaid Other | Attending: Emergency Medicine | Admitting: Emergency Medicine

## 2011-02-25 DIAGNOSIS — R51 Headache: Secondary | ICD-10-CM | POA: Insufficient documentation

## 2011-02-25 DIAGNOSIS — J329 Chronic sinusitis, unspecified: Secondary | ICD-10-CM | POA: Insufficient documentation
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
## 2011-04-03 ENCOUNTER — Other Ambulatory Visit: Payer: Self-pay | Admitting: Obstetrics and Gynecology

## 2011-04-03 DIAGNOSIS — N644 Mastodynia: Secondary | ICD-10-CM

## 2011-04-05 ENCOUNTER — Other Ambulatory Visit: Payer: Self-pay

## 2011-05-10 ENCOUNTER — Inpatient Hospital Stay (INDEPENDENT_AMBULATORY_CARE_PROVIDER_SITE_OTHER)
Admission: RE | Admit: 2011-05-10 | Discharge: 2011-05-10 | Disposition: A | Discharge status: Home / Self Care | Payer: Medicaid Other | Source: Ambulatory Visit | Attending: Emergency Medicine | Admitting: Emergency Medicine

## 2011-05-10 DIAGNOSIS — L988 Other specified disorders of the skin and subcutaneous tissue: Secondary | ICD-10-CM

## 2011-07-26 LAB — URINALYSIS, ROUTINE W REFLEX MICROSCOPIC
Bilirubin Urine: NEGATIVE
Glucose, UA: NEGATIVE
Specific Gravity, Urine: 1.023
pH: 6.5

## 2011-07-26 LAB — URINE MICROSCOPIC-ADD ON

## 2011-07-26 LAB — POCT I-STAT, CHEM 8
Chloride: 102
Glucose, Bld: 96
HCT: 46
Hemoglobin: 15.6 — ABNORMAL HIGH
Potassium: 3.8
Sodium: 138

## 2011-08-02 LAB — DIFFERENTIAL
Basophils Absolute: 0.1 10*3/uL (ref 0.0–0.1)
Eosinophils Absolute: 0.1 10*3/uL (ref 0.0–0.7)
Eosinophils Relative: 1 % (ref 0–5)
Lymphocytes Relative: 22 % (ref 12–46)
Lymphs Abs: 1.5 10*3/uL (ref 0.7–4.0)
Monocytes Absolute: 0.4 10*3/uL (ref 0.1–1.0)

## 2011-08-02 LAB — CBC
HCT: 41.1 % (ref 36.0–46.0)
Hemoglobin: 13.8 g/dL (ref 12.0–15.0)
MCV: 93.1 fL (ref 78.0–100.0)
RDW: 13.2 % (ref 11.5–15.5)

## 2011-08-02 LAB — URINALYSIS, ROUTINE W REFLEX MICROSCOPIC
Bilirubin Urine: NEGATIVE
Nitrite: NEGATIVE
Specific Gravity, Urine: 1.023 (ref 1.005–1.030)
Urobilinogen, UA: 0.2 mg/dL (ref 0.0–1.0)

## 2011-08-02 LAB — POCT I-STAT, CHEM 8
BUN: 13 mg/dL (ref 6–23)
Calcium, Ion: 1.18 mmol/L (ref 1.12–1.32)
Creatinine, Ser: 0.8 mg/dL (ref 0.4–1.2)
TCO2: 24 mmol/L (ref 0–100)

## 2011-08-02 LAB — POCT PREGNANCY, URINE: Preg Test, Ur: NEGATIVE

## 2011-08-02 LAB — URINE MICROSCOPIC-ADD ON

## 2011-09-25 ENCOUNTER — Encounter (HOSPITAL_COMMUNITY): Payer: Self-pay | Admitting: *Deleted

## 2011-09-25 ENCOUNTER — Inpatient Hospital Stay (HOSPITAL_COMMUNITY)
Admission: AD | Admit: 2011-09-25 | Discharge: 2011-09-25 | Disposition: A | Discharge status: Home / Self Care | Payer: Medicaid Other | Source: Ambulatory Visit | Attending: Obstetrics & Gynecology | Admitting: Obstetrics & Gynecology

## 2011-09-25 DIAGNOSIS — R109 Unspecified abdominal pain: Secondary | ICD-10-CM

## 2011-09-25 DIAGNOSIS — R1031 Right lower quadrant pain: Secondary | ICD-10-CM | POA: Insufficient documentation

## 2011-09-25 HISTORY — DX: Other specified health status: Z78.9

## 2011-09-25 LAB — CBC
HCT: 36.2 % (ref 36.0–46.0)
Hemoglobin: 12 g/dL (ref 12.0–15.0)
MCV: 91.6 fL (ref 78.0–100.0)
RBC: 3.95 MIL/uL (ref 3.87–5.11)
WBC: 6.5 10*3/uL (ref 4.0–10.5)

## 2011-09-25 LAB — WET PREP, GENITAL
Trich, Wet Prep: NONE SEEN
Yeast Wet Prep HPF POC: NONE SEEN

## 2011-09-25 LAB — URINALYSIS, ROUTINE W REFLEX MICROSCOPIC
Bilirubin Urine: NEGATIVE
Glucose, UA: NEGATIVE mg/dL
Hgb urine dipstick: NEGATIVE
Ketones, ur: NEGATIVE mg/dL
Protein, ur: NEGATIVE mg/dL

## 2011-09-25 LAB — URINE MICROSCOPIC-ADD ON

## 2011-09-25 MED ORDER — IBUPROFEN 600 MG PO TABS: 600.0000 mg | ORAL_TABLET | Freq: Four times a day (QID) | ORAL | Status: AC | PRN

## 2011-09-25 NOTE — Progress Notes (Signed)
Pt states she has been having lower abdominal pain x2 weeks. Hx of ovarian cysts. Pt taking nothing for pain. Pt states she had the implanon in July 2012 by Dr. Tenny Craw.

## 2011-09-25 NOTE — Progress Notes (Signed)
Pt states she has had pain about 2 wks. Pain has been coming and going, last 2 night pain has gotten worse. Pt states she has a hx of having ovarian cysts.

## 2011-09-25 NOTE — ED Provider Notes (Signed)
Melinda A Perdue25 [id number] Chief Complaint  Patient presents with  . Abdominal Pain    SUBJECTIVE  HPI: Presents with two-week history intermittent, sharp RLQ abdominal pain. For the last 3 days the pain has increased in intensity, is still sharp in character, and is constant. It's worse with pressing in her right lower quadrant and slightly worse with moving and position changes. Not exacerbated by eating. She feels it is similar to the pain she experienced in about 2006 when she had an ovarian cyst that required surgical drainage. Has had some nausea but no vomiting; she is eating normally and had a normal bowel movement yesterday.Denies fever, chills, constipation, diarrhea. No dysuria, frequency, hematuria; no irritative vaginal discharge or new sexual contact. Not having regular menses, just occasional spotting since Implanon inserted in July 2012 at [facility]. Called there today and was told they could not work her in for a visit today.  Past Medical History  Diagnosis Date  . No pertinent past medical history    Past Surgical History  Procedure Date  . Laproscopy    History   Social History  . Marital Status: Divorced    Spouse Name: N/A    Number of Children: N/A  . Years of Education: N/A   Occupational History  . Not on file.   Social History Main Topics  . Smoking status: Not on file  . Smokeless tobacco: Not on file  . Alcohol Use: Not on file  . Drug Use: Not on file  . Sexually Active: Not on file   Other Topics Concern  . Not on file   Social History Narrative  . No narrative on file   No current facility-administered medications on file prior to encounter.   No current outpatient prescriptions on file prior to encounter.   Allergies  Allergen Reactions  . Amoxicillin Hives    Pt states she also get a temp.  . Prednisone Hives    Patient states she also got a fever (patient states she was not on any other meds at time of reaction)    ROS: Pertinent  items in HPI  OBJECTIVE  BP 114/63  Pulse 72  Temp(Src) 97.9 F (36.6 C) (Oral)  Resp 20  Ht 5\' 5"  (1.651 m)  Wt 67.132 kg (148 lb)  BMI 24.63 kg/m2  LMP 06/30/2011   Physical Exam  Constitutional: She is oriented to person, place, and time and well-developed, well-nourished, and in no distress. No distress.  HENT:  Head: Normocephalic and atraumatic.  Neck: Neck supple.  Cardiovascular: Normal rate and regular rhythm.   Pulmonary/Chest: Effort normal.  Abdominal: Soft. Bowel sounds are normal. She exhibits no distension and no mass. There is tenderness. There is guarding. There is no rebound.       Mod TTP on deep palpation. +/- rebound, distractible.  Genitourinary: Uterus normal, cervix normal, right adnexa normal and left adnexa normal. Vaginal discharge found.       Mod creamy discharge.  No CMT, uterus retroverted, NT, No adnexal mass, tenderness on exam rt adnexa.  Musculoskeletal: Normal range of motion.  Neurological: She is alert and oriented to person, place, and time.  Skin: Skin is warm and dry.  Psychiatric: Mood and affect normal.   Results for orders placed during the hospital encounter of 09/25/11 (from the past 24 hour(s))  URINALYSIS, ROUTINE W REFLEX MICROSCOPIC     Status: Abnormal   Collection Time   09/25/11  2:50 PM      Component  Value Range   Color, Urine YELLOW  YELLOW    Appearance CLEAR  CLEAR    Specific Gravity, Urine 1.010  1.005 - 1.030    pH 7.0  5.0 - 8.0    Glucose, UA NEGATIVE  NEGATIVE (mg/dL)   Hgb urine dipstick NEGATIVE  NEGATIVE    Bilirubin Urine NEGATIVE  NEGATIVE    Ketones, ur NEGATIVE  NEGATIVE (mg/dL)   Protein, ur NEGATIVE  NEGATIVE (mg/dL)   Urobilinogen, UA 0.2  0.0 - 1.0 (mg/dL)   Nitrite NEGATIVE  NEGATIVE    Leukocytes, UA SMALL (*) NEGATIVE   URINE MICROSCOPIC-ADD ON     Status: Abnormal   Collection Time   09/25/11  2:50 PM      Component Value Range   Squamous Epithelial / LPF MANY (*) RARE    WBC, UA 3-6   <3 (WBC/hpf)   Bacteria, UA FEW (*) RARE   POCT PREGNANCY, URINE     Status: Normal   Collection Time   09/25/11  3:19 PM      Component Value Range   Preg Test, Ur NEGATIVE    WET PREP, GENITAL     Status: Abnormal   Collection Time   09/25/11  4:22 PM      Component Value Range   Yeast, Wet Prep NONE SEEN  NONE SEEN    Trich, Wet Prep NONE SEEN  NONE SEEN    Clue Cells, Wet Prep FEW (*) NONE SEEN    WBC, Wet Prep HPF POC FEW (*) NONE SEEN   CBC     Status: Normal   Collection Time   09/25/11  4:25 PM      Component Value Range   WBC 6.5  4.0 - 10.5 (K/uL)   RBC 3.95  3.87 - 5.11 (MIL/uL)   Hemoglobin 12.0  12.0 - 15.0 (g/dL)   HCT 19.1  47.8 - 29.5 (%)   MCV 91.6  78.0 - 100.0 (fL)   MCH 30.4  26.0 - 34.0 (pg)   MCHC 33.1  30.0 - 36.0 (g/dL)   RDW 62.1  30.8 - 65.7 (%)   Platelets 191  150 - 400 (K/uL)    ASSESSMENT  Abdominal pain of unclear etiology. No evidence acute abdomen or api. Doubt ovarian cyst since on Implanon   PLAN D/W Dr. Arlyce Dice. Pt to F/U in office if pain continues. General relief measures. Rx ibuprofen 600 q6h

## 2011-09-25 NOTE — ED Provider Notes (Signed)
This patient was seen, diagnosed, and treated by the EDP.  Hospital policy requires me to sign off on this encounter.  

## 2011-09-26 LAB — GC/CHLAMYDIA PROBE AMP, GENITAL
Chlamydia, DNA Probe: NEGATIVE
GC Probe Amp, Genital: NEGATIVE

## 2011-10-17 ENCOUNTER — Emergency Department (HOSPITAL_COMMUNITY)
Admission: EM | Admit: 2011-10-17 | Discharge: 2011-10-17 | Discharge status: Against Medical Advice | Payer: Medicaid Other | Attending: Emergency Medicine | Admitting: Emergency Medicine

## 2011-10-17 ENCOUNTER — Encounter (HOSPITAL_COMMUNITY): Payer: Self-pay | Admitting: Emergency Medicine

## 2011-10-17 DIAGNOSIS — M542 Cervicalgia: Secondary | ICD-10-CM | POA: Insufficient documentation

## 2011-10-17 DIAGNOSIS — R6889 Other general symptoms and signs: Secondary | ICD-10-CM | POA: Insufficient documentation

## 2011-10-17 NOTE — ED Notes (Signed)
Pt states she noticed "bumps...abcesses" to the left lower scalp area that started about 3 days ago and are "tender" to touch. Pt also states she noticed "knot" on the left side of her neck that started prior to scalp "bumps" and is also "tender."

## 2012-02-14 ENCOUNTER — Emergency Department (HOSPITAL_BASED_OUTPATIENT_CLINIC_OR_DEPARTMENT_OTHER)
Admission: EM | Admit: 2012-02-14 | Discharge: 2012-02-14 | Disposition: A | Discharge status: Home / Self Care | Payer: Self-pay | Attending: Emergency Medicine | Admitting: Emergency Medicine

## 2012-02-14 ENCOUNTER — Encounter (HOSPITAL_BASED_OUTPATIENT_CLINIC_OR_DEPARTMENT_OTHER): Payer: Self-pay | Admitting: Emergency Medicine

## 2012-02-14 ENCOUNTER — Emergency Department (INDEPENDENT_AMBULATORY_CARE_PROVIDER_SITE_OTHER): Payer: Self-pay

## 2012-02-14 DIAGNOSIS — S59909A Unspecified injury of unspecified elbow, initial encounter: Secondary | ICD-10-CM

## 2012-02-14 DIAGNOSIS — S59919A Unspecified injury of unspecified forearm, initial encounter: Secondary | ICD-10-CM | POA: Insufficient documentation

## 2012-02-14 DIAGNOSIS — IMO0001 Reserved for inherently not codable concepts without codable children: Secondary | ICD-10-CM | POA: Insufficient documentation

## 2012-02-14 DIAGNOSIS — M79609 Pain in unspecified limb: Secondary | ICD-10-CM

## 2012-02-14 DIAGNOSIS — X58XXXA Exposure to other specified factors, initial encounter: Secondary | ICD-10-CM

## 2012-02-14 DIAGNOSIS — S6990XA Unspecified injury of unspecified wrist, hand and finger(s), initial encounter: Secondary | ICD-10-CM

## 2012-02-14 DIAGNOSIS — X500XXA Overexertion from strenuous movement or load, initial encounter: Secondary | ICD-10-CM | POA: Insufficient documentation

## 2012-02-14 MED ORDER — HYDROCODONE-ACETAMINOPHEN 5-325 MG PO TABS: 2.0000 | ORAL_TABLET | ORAL | Status: AC | PRN

## 2012-02-14 MED ORDER — HYDROCODONE-ACETAMINOPHEN 5-325 MG PO TABS
2.0000 | ORAL_TABLET | Freq: Once | ORAL | Status: AC
  Administered 2012-02-14: 2 via ORAL
  Filled 2012-02-14: qty 2

## 2012-02-14 NOTE — ED Notes (Signed)
Pt was working on her car.  Put a lot of pressure on a bolt and felt a pop in her right elbow.  Pain from right elbow to hand.  Able to move minimally but with pain.  Sensation intact distal to injury.  Strong radial pulse.

## 2012-02-14 NOTE — Discharge Instructions (Signed)
Elbow Injury   Minor fractures, sprains, and bruises of the elbow will all cause swelling and pain. X-rays often show swelling around the joint but may not show a fracture line on x-rays taken right after the injury. The treatment for all these types of injuries is to reduce swelling and pain and to rest the joint until movement is painless. Repeat exam and x-rays several weeks after an elbow injury may show a minor fracture not seen on the initial exam.   Most of the time a sling is needed for the first days or weeks after the injury. Apply ice packs to the elbow for 20-30 minutes every 2 hours for the next few days. Keep your elbow elevated above the level of your heart as much as possible until the pain and swelling are better. An elastic wrap or splint may also be used to reduce movement in addition to a sling. Call your caregiver for follow-up care within one week. Keeping the elbow immobilized for too long can hurt recovery.   SEEK MEDICAL CARE IF:   Your pain increases, or if you develop a numb, cold, or pale forearm or hand.   You are not improving.   You have any other questions or concerns regarding your injury.   Document Released: 11/22/2004 Document Revised: 10/04/2011 Document Reviewed: 11/03/2008   Exit[employer] Patient Information 2012 [employer].

## 2012-02-14 NOTE — ED Provider Notes (Signed)
History     CSN: [id number]  Arrival date & time 02/14/12  1615   First MD Initiated Contact with Patient 02/14/12 1630      Chief Complaint  Patient presents with  . Arm Injury    (Consider location/radiation/quality/duration/timing/severity/associated sxs/prior treatment) Patient is a 26 y.o. female presenting with arm injury. The history is provided by the patient. No language interpreter was used.  Arm Injury  The incident occurred just prior to arrival. The injury mechanism was a twisted limb. The context of the injury is unknown. No protective equipment was used. There is an injury to the right elbow. She is right-handed. Her tetanus status is UTD.  Pt was changing her brakes and her elbow popped.  Pt complains of swelling and pain.  Past Medical History  Diagnosis Date  . No pertinent past medical history     Past Surgical History  Procedure Date  . Laproscopy   . Ovarian cyst drainage   . Ovarian cyst drainage     No family history on file.  History  Substance Use Topics  . Smoking status: Current Everyday Smoker -- 0.5 packs/day  . Smokeless tobacco: Not on file  . Alcohol Use: No    OB History    Grav Para Term Preterm Abortions TAB SAB Ect Mult Living   3 1 1  0 2 0 2 0 0 1      Review of Systems  Musculoskeletal: Positive for myalgias and joint swelling.  All other systems reviewed and are negative.    Allergies  Amoxicillin and Prednisone  Home Medications   Current Outpatient Rx  Name Route Sig Dispense Refill  . IBUPROFEN 200 MG PO TABS Oral Take 200 mg by mouth every 6 (six) hours as needed. pain     . ETONOGESTREL 68 MG [place] IMPL Subcutaneous Inject 1 each into the skin once. Implanted in June 2012..Stays in place for 3 years.      BP 116/74  Pulse 80  Temp(Src) 98.3 F (36.8 C) (Oral)  Resp 16  Ht 5\' 4"  (1.626 m)  Wt 153 lb (69.4 kg)  BMI 26.26 kg/m2  SpO2 99%  LMP 01/28/2012  Physical Exam  Nursing note and vitals  reviewed. Constitutional: She appears well-developed and well-nourished.  HENT:  Head: Normocephalic and atraumatic.  Eyes: Pupils are equal, round, and reactive to light.  Musculoskeletal: She exhibits tenderness.       Tender right elbow,  Decreased range of motion,  nv and ns intact  Neurological: She is alert.  Skin: Skin is warm.  Psychiatric: She has a normal mood and affect.    ED Course  Procedures (including critical care time)  Labs Reviewed - No data to display Dg Elbow Complete Right  02/14/2012  *RADIOLOGY REPORT*  Clinical Data: Trauma, pain, popping sensation  RIGHT ELBOW - COMPLETE 3+ VIEW  Comparison: None.  Findings: Normal alignment without fracture or effusion.  Preserved joint spaces.  No significant arthritic change.  IMPRESSION: No acute osseous finding  Original Report Authenticated By: Judie Petit. Ruel Favors, M.D.     No diagnosis found.    MDM  No fx,  Pt placed in a sling.  I advised follow up with Dr. Pearletha Forge next week.        Lonia Skinner [place], [place] 02/14/12 [phone]

## 2012-02-16 NOTE — ED Provider Notes (Signed)
Medical screening examination/treatment/procedure(s) were performed by non-physician practitioner and as supervising physician I was immediately available for consultation/collaboration.  Conny Situ, MD 02/16/12 0741 

## 2012-03-14 ENCOUNTER — Emergency Department: Payer: Self-pay | Admitting: Emergency Medicine

## 2012-09-05 ENCOUNTER — Encounter (HOSPITAL_COMMUNITY): Payer: Self-pay | Admitting: *Deleted

## 2012-09-05 ENCOUNTER — Emergency Department (HOSPITAL_COMMUNITY)
Admission: EM | Admit: 2012-09-05 | Discharge: 2012-09-05 | Disposition: A | Discharge status: Home / Self Care | Payer: Self-pay | Attending: Emergency Medicine | Admitting: Emergency Medicine

## 2012-09-05 ENCOUNTER — Emergency Department (HOSPITAL_COMMUNITY): Payer: Self-pay

## 2012-09-05 DIAGNOSIS — S20219A Contusion of unspecified front wall of thorax, initial encounter: Secondary | ICD-10-CM | POA: Insufficient documentation

## 2012-09-05 DIAGNOSIS — Y9389 Activity, other specified: Secondary | ICD-10-CM | POA: Insufficient documentation

## 2012-09-05 DIAGNOSIS — F17200 Nicotine dependence, unspecified, uncomplicated: Secondary | ICD-10-CM | POA: Insufficient documentation

## 2012-09-05 DIAGNOSIS — R296 Repeated falls: Secondary | ICD-10-CM | POA: Insufficient documentation

## 2012-09-05 DIAGNOSIS — Y929 Unspecified place or not applicable: Secondary | ICD-10-CM | POA: Insufficient documentation

## 2012-09-05 LAB — COMPREHENSIVE METABOLIC PANEL
Albumin: 4.7 g/dL (ref 3.5–5.2)
Alkaline Phosphatase: 65 U/L (ref 39–117)
BUN: 8 mg/dL (ref 6–23)
Creatinine, Ser: 0.78 mg/dL (ref 0.50–1.10)
GFR calc Af Amer: >90 mL/min (ref >90)
Glucose, Bld: 94 mg/dL (ref 70–99)
Total Bilirubin: 0.4 mg/dL (ref 0.3–1.2)
Total Protein: 7.8 g/dL (ref 6.0–8.3)

## 2012-09-05 LAB — CBC WITH DIFFERENTIAL/PLATELET
Basophils Relative: 0 % (ref 0–1)
Eosinophils Absolute: 0 10*3/uL (ref 0.0–0.7)
HCT: 42.2 % (ref 36.0–46.0)
Hemoglobin: 13.4 g/dL (ref 12.0–15.0)
Lymphs Abs: 1.8 10*3/uL (ref 0.7–4.0)
MCH: 28.4 pg (ref 26.0–34.0)
MCHC: 31.8 g/dL (ref 30.0–36.0)
MCV: 89.4 fL (ref 78.0–100.0)
Monocytes Absolute: 0.3 10*3/uL (ref 0.1–1.0)
Monocytes Relative: 5 % (ref 3–12)
RBC: 4.72 MIL/uL (ref 3.87–5.11)

## 2012-09-05 LAB — URINALYSIS, ROUTINE W REFLEX MICROSCOPIC
Bilirubin Urine: NEGATIVE
Ketones, ur: 15 mg/dL — AB
Leukocytes, UA: NEGATIVE
Nitrite: NEGATIVE
Specific Gravity, Urine: 1.017 (ref 1.005–1.030)
Urobilinogen, UA: 0.2 mg/dL (ref 0.0–1.0)
pH: 6 (ref 5.0–8.0)

## 2012-09-05 LAB — OCCULT BLOOD, POC DEVICE: Fecal Occult Bld: NEGATIVE

## 2012-09-05 MED ORDER — OXYCODONE-ACETAMINOPHEN 5-325 MG PO TABS: 2.0000 | ORAL_TABLET | ORAL | Status: DC | PRN

## 2012-09-05 MED ORDER — IBUPROFEN 600 MG PO TABS: 600.0000 mg | ORAL_TABLET | Freq: Four times a day (QID) | ORAL | Status: DC | PRN

## 2012-09-05 MED ORDER — MORPHINE SULFATE 4 MG/ML IJ SOLN
6.0000 mg | Freq: Once | INTRAMUSCULAR | Status: AC
  Administered 2012-09-05: 6 mg via INTRAVENOUS
  Filled 2012-09-05: qty 2

## 2012-09-05 MED ORDER — SODIUM CHLORIDE 0.9 % IV BOLUS (SEPSIS): 1000.0000 mL | Freq: Once | INTRAVENOUS | Status: DC

## 2012-09-05 MED ORDER — MORPHINE SULFATE 4 MG/ML IJ SOLN
4.0000 mg | Freq: Once | INTRAMUSCULAR | Status: AC
  Administered 2012-09-05: 4 mg via INTRAVENOUS
  Filled 2012-09-05: qty 1

## 2012-09-05 MED ORDER — KETOROLAC TROMETHAMINE 30 MG/ML IJ SOLN
30.0000 mg | Freq: Once | INTRAMUSCULAR | Status: AC
  Administered 2012-09-05: 30 mg via INTRAVENOUS
  Filled 2012-09-05: qty 1

## 2012-09-05 NOTE — ED Provider Notes (Signed)
History     CSN: [id number]  Arrival date & time 09/05/12  1014   First MD Initiated Contact with Patient 09/05/12 1042      Chief Complaint  Patient presents with  . Fall    (Consider location/radiation/quality/duration/timing/severity/associated sxs/prior treatment) The history is provided by the patient.  Melinda Mccoy is a 26 y.o. female here s/p fall. Yesterday, she was picking up her sons's toys when she stepped on a toy and fell and hit the coffee table on the R side of her chest. Took some motrin but didn't help. No cough or fever. No abdominal pain or vomiting. This AM, she had an episode of black stool. No syncope. No head injury or LOC.    Past Medical History  Diagnosis Date  . No pertinent past medical history     Past Surgical History  Procedure Date  . Laproscopy   . Ovarian cyst drainage   . Ovarian cyst drainage     No family history on file.  History  Substance Use Topics  . Smoking status: Current Every Day Smoker -- 0.5 packs/day  . Smokeless tobacco: Not on file  . Alcohol Use: No    OB History    Grav Para Term Preterm Abortions TAB SAB Ect Mult Living   3 1 1  0 2 0 2 0 0 1      Review of Systems  Musculoskeletal:       Rib pain   All other systems reviewed and are negative.    Allergies  Amoxicillin and Prednisone  Home Medications   Current Outpatient Rx  Name  Route  Sig  Dispense  Refill  . AMPHETAMINE-DEXTROAMPHET ER 25 MG PO CP24   Oral   Take 25 mg by mouth every morning.         . ASPIRIN-ACETAMINOPHEN 500-325 MG PO PACK   Oral   Take 1 packet by mouth once. Pain         . ETONOGESTREL 68 MG [place] IMPL   Subcutaneous   Inject 1 each into the skin once. Implanted in June 2012..Stays in place for 3 years.         . IBUPROFEN 200 MG PO TABS   Oral   Take 600 mg by mouth every 6 (six) hours as needed. pain           BP 106/64  Pulse 64  Temp 98.4 F (36.9 C) (Oral)  Resp 16  SpO2 100%  LMP  08/22/2012  Physical Exam  Nursing note and vitals reviewed. Constitutional: She is oriented to person, place, and time. She appears well-developed and well-nourished.       Uncomfortable, holding R side of her chest   HENT:  Head: Normocephalic.  Mouth/Throat: Oropharynx is clear and moist.  Eyes: Conjunctivae normal are normal. Pupils are equal, round, and reactive to light.  Neck: Normal range of motion. Neck supple.       No midline tenderness   Cardiovascular: Normal rate, regular rhythm and normal heart sounds.   Pulmonary/Chest: Effort normal and breath sounds normal. No respiratory distress. She has no wheezes. She has no rales.       There is a bruise on R lower ribs. Reproducible tenderness.   Abdominal: Soft. Bowel sounds are normal. She exhibits no distension. There is no tenderness. There is no rebound.       Rectal- brown stool. No hemorrhoids.   Musculoskeletal: Normal range of motion. She exhibits no edema and  no tenderness.  Neurological: She is alert and oriented to person, place, and time.  Skin: Skin is warm and dry.  Psychiatric: She has a normal mood and affect. Her behavior is normal. Judgment and thought content normal.    ED Course  Procedures (including critical care time)  Labs Reviewed  URINALYSIS, ROUTINE W REFLEX MICROSCOPIC - Abnormal; Notable for the following:    APPearance CLOUDY (*)     Ketones, ur 15 (*)     All other components within normal limits  CBC WITH DIFFERENTIAL  COMPREHENSIVE METABOLIC PANEL  OCCULT BLOOD, POC DEVICE  PREGNANCY, URINE   Dg Chest 2 View  09/05/2012  *RADIOLOGY REPORT*  Clinical Data: Fall, right side chest pain  CHEST - 2 VIEW  Comparison: 12/25/2010  Findings: Normal heart size, mediastinal contours, and pulmonary vascularity. Lungs clear. No pleural effusion or pneumothorax. Bones unremarkable.  IMPRESSION: No acute abnormalities.   Original Report Authenticated By: Ulyses Southward, M.D.      No diagnosis  found.    MDM  Melinda Mccoy is a 26 y.o. female here with chest pain s/p fall. Occ negative. Will get chest xray and give pain meds and reassess.   3:28 PM CXR showed no fractures. Labs nl. Occ negative. No hematuria on UA. Pain improved. Her mom will pick her up.        Richardean Canal, MD 09/05/12 [phone]

## 2012-09-05 NOTE — ED Notes (Signed)
Pt reports she was picking up her son's toys off the floor yesterday when she stepped on one and fell.  Pt reports landing on a coffee table hitting her R side.  Reddened area noted on her R rib area.  Pt reports "really black and mucus like almost" stool this am.  Pt denies any abd pain or n/v at this time.

## 2012-10-31 ENCOUNTER — Emergency Department (HOSPITAL_COMMUNITY)
Admission: EM | Admit: 2012-10-31 | Discharge: 2012-10-31 | Disposition: A | Discharge status: Home / Self Care | Payer: Self-pay | Attending: Emergency Medicine | Admitting: Emergency Medicine

## 2012-10-31 ENCOUNTER — Encounter (HOSPITAL_COMMUNITY): Payer: Self-pay | Admitting: Emergency Medicine

## 2012-10-31 DIAGNOSIS — Z79899 Other long term (current) drug therapy: Secondary | ICD-10-CM | POA: Insufficient documentation

## 2012-10-31 DIAGNOSIS — Z7982 Long term (current) use of aspirin: Secondary | ICD-10-CM | POA: Insufficient documentation

## 2012-10-31 DIAGNOSIS — F17200 Nicotine dependence, unspecified, uncomplicated: Secondary | ICD-10-CM | POA: Insufficient documentation

## 2012-10-31 DIAGNOSIS — M542 Cervicalgia: Secondary | ICD-10-CM | POA: Insufficient documentation

## 2012-10-31 DIAGNOSIS — R52 Pain, unspecified: Secondary | ICD-10-CM | POA: Insufficient documentation

## 2012-10-31 DIAGNOSIS — J020 Streptococcal pharyngitis: Secondary | ICD-10-CM | POA: Insufficient documentation

## 2012-10-31 MED ORDER — HYDROCODONE-ACETAMINOPHEN 7.5-500 MG/15ML PO SOLN: 15.0000 mL | Freq: Four times a day (QID) | ORAL | Status: DC | PRN

## 2012-10-31 MED ORDER — CLINDAMYCIN HCL 300 MG PO CAPS
300.0000 mg | ORAL_CAPSULE | Freq: Once | ORAL | Status: AC
  Administered 2012-10-31: 300 mg via ORAL
  Filled 2012-10-31: qty 1

## 2012-10-31 MED ORDER — CLINDAMYCIN HCL 150 MG PO CAPS: 150.0000 mg | ORAL_CAPSULE | Freq: Four times a day (QID) | ORAL | Status: DC

## 2012-10-31 NOTE — ED Provider Notes (Signed)
History     CSN: [id number]  Arrival date & time 10/31/12  4098   First MD Initiated Contact with Patient 10/31/12 1034      Chief Complaint  Patient presents with  . Fever  . Sore Throat    (Consider location/radiation/quality/duration/timing/severity/associated sxs/prior treatment) HPI  26 year old female presents complaining of sore throat. Patient reports for the past 3 days she has fever as high as 103, sore throat, neck pain, and body aches. Sore throat is moderate in severity, worsening with swallowing, has been persistent not relief after taking ibuprofen and throat lozenges. Onset was gradual, persistent, moderate in severity, nonradiating. Patient denies sneezing, runny nose, ear pain, chest pain, shortness of breath, nausea, vomiting, diarrhea, or abdominal pain. She denies any rash. No recent sick contact. Patient is a smoker.  Past Medical History  Diagnosis Date  . No pertinent past medical history     Past Surgical History  Procedure Date  . Laproscopy   . Ovarian cyst drainage   . Ovarian cyst drainage     No family history on file.  History  Substance Use Topics  . Smoking status: Current Every Day Smoker -- 0.5 packs/day  . Smokeless tobacco: Not on file  . Alcohol Use: No    OB History    Grav Para Term Preterm Abortions TAB SAB Ect Mult Living   3 1 1  0 2 0 2 0 0 1      Review of Systems  Constitutional: Positive for fever and chills.  HENT: Positive for sore throat, trouble swallowing and neck pain. Negative for ear pain, congestion, rhinorrhea and voice change.   Respiratory: Negative for cough and shortness of breath.   Gastrointestinal: Negative for abdominal pain.    Allergies  Amoxicillin and Prednisone  Home Medications   Current Outpatient Rx  Name  Route  Sig  Dispense  Refill  . AMPHETAMINE-DEXTROAMPHET ER 25 MG PO CP24   Oral   Take 25 mg by mouth every morning.         . ASPIRIN-ACETAMINOPHEN 500-325 MG PO PACK    Oral   Take 1 packet by mouth once. Pain         . ETONOGESTREL 68 MG [place] IMPL   Subcutaneous   Inject 1 each into the skin once. Implanted in June 2012..Stays in place for 3 years.         . IBUPROFEN 200 MG PO TABS   Oral   Take 600 mg by mouth every 6 (six) hours as needed. pain         . IBUPROFEN 600 MG PO TABS   Oral   Take 1 tablet (600 mg total) by mouth every 6 (six) hours as needed for pain.   30 tablet   0   . OXYCODONE-ACETAMINOPHEN 5-325 MG PO TABS   Oral   Take 2 tablets by mouth every 4 (four) hours as needed for pain.   15 tablet   0     BP 113/69  Pulse 80  Temp 97.9 F (36.6 C) (Oral)  Resp 16  SpO2 100%  LMP 10/17/2012  Physical Exam  Nursing note and vitals reviewed. Constitutional: She is oriented to person, place, and time. She appears well-developed and well-nourished. No distress.  HENT:  Head: Atraumatic.  Right Ear: External ear normal.  Left Ear: External ear normal.  Nose: Nose normal.  Mouth/Throat: Uvula is midline and mucous membranes are normal. Oropharyngeal exudate and posterior oropharyngeal erythema present. No  tonsillar abscesses.  Eyes: Conjunctivae normal are normal.  Neck: Neck supple.  Cardiovascular: Normal rate and regular rhythm.   Pulmonary/Chest: Effort normal and breath sounds normal.  Abdominal: Soft. She exhibits no distension and no mass. There is no tenderness.  Lymphadenopathy:    She has cervical adenopathy.  Neurological: She is alert and oriented to person, place, and time.  Skin: Skin is warm. No rash noted. No erythema.  Psychiatric: She has a normal mood and affect.    ED Course  Procedures (including critical care time)   Labs Reviewed  RAPID STREP SCREEN   No results found.   No diagnosis found.  Results for orders placed during the hospital encounter of 10/31/12  RAPID STREP SCREEN      Component Value Range   Streptococcus, Group A Screen (Direct) POSITIVE (*) NEGATIVE   No  results found.  1. Strep pharyngitis  MDM  Patient presents with fever, sore throat, neck pain, and no cough. She needs central criteria concerning for strep pharyngitis. Rapid strep test obtained.  No evidence of deep tissue infection.  No PTA  Patient is allergic to amoxicillin and prednisone. strep test is positive for strep throat. Patient will be treated with clindamycin. ENT referral given as needed.  BP 113/69  Pulse 80  Temp 97.9 F (36.6 C) (Oral)  Resp 16  SpO2 100%  LMP 10/17/2012  I have reviewed nursing notes and vital signs.  I reviewed available ER/hospitalization records thought the EMR      Fayrene Helper, [place] 10/31/12 1105

## 2012-10-31 NOTE — ED Provider Notes (Signed)
Medical screening examination/treatment/procedure(s) were performed by non-physician practitioner and as supervising physician I was immediately available for consultation/collaboration.  Raeford Razor, MD 10/31/12 [phone]

## 2012-10-31 NOTE — ED Notes (Signed)
Per patient, fever, sore throat for 3 days-no relief with over the counter meds

## 2014-01-09 ENCOUNTER — Emergency Department (HOSPITAL_COMMUNITY)
Admission: EM | Admit: 2014-01-09 | Discharge: 2014-01-09 | Disposition: A | Discharge status: Home / Self Care | Payer: Medicaid Other | Source: Home / Self Care | Attending: Family Medicine | Admitting: Family Medicine

## 2014-01-09 ENCOUNTER — Encounter (HOSPITAL_COMMUNITY): Payer: Self-pay | Admitting: Emergency Medicine

## 2014-01-09 DIAGNOSIS — J069 Acute upper respiratory infection, unspecified: Secondary | ICD-10-CM

## 2014-01-09 MED ORDER — HYDROCOD POLST-CHLORPHEN POLST 10-8 MG/5ML PO LQCR: 5.0000 mL | Freq: Two times a day (BID) | ORAL | Status: DC | PRN

## 2014-01-09 MED ORDER — IPRATROPIUM BROMIDE 0.06 % NA SOLN: 2.0000 | Freq: Four times a day (QID) | NASAL | Status: DC

## 2014-01-09 NOTE — ED Provider Notes (Deleted)
CSN: [id number]     Arrival date & time 01/09/14  1223 History   First MD Initiated Contact with Patient 01/09/14 1246     Chief Complaint  Patient presents with  . URI   (Consider location/radiation/quality/duration/timing/severity/associated sxs/prior Treatment) HPI  Past Medical History  Diagnosis Date  . No pertinent past medical history    Past Surgical History  Procedure Laterality Date  . Laproscopy    . Ovarian cyst drainage    . Ovarian cyst drainage     No family history on file. History  Substance Use Topics  . Smoking status: Current Every Day Smoker -- 0.50 packs/day  . Smokeless tobacco: Not on file  . Alcohol Use: No   OB History   Grav Para Term Preterm Abortions TAB SAB Ect Mult Living   3 1 1  0 2 0 2 0 0 1     Review of Systems  Allergies  Amoxicillin and Prednisone  Home Medications   Current Outpatient Rx  Name  Route  Sig  Dispense  Refill  . amphetamine-dextroamphetamine (ADDERALL XR) 25 MG 24 hr capsule   Oral   Take 25 mg by mouth every morning.         . Aspirin-Acetaminophen (GOODY BODY PAIN) 500-325 MG PACK   Oral   Take 1 packet by mouth once. Pain         . chlorpheniramine-HYDROcodone (TUSSIONEX PENNKINETIC ER) 10-8 MG/5ML LQCR   Oral   Take 5 mLs by mouth every 12 (twelve) hours as needed for cough.   115 mL   0   . etonogestrel (IMPLANON) 68 MG IMPL implant   Subcutaneous   Inject 1 each into the skin once. Implanted in June 2012..Stays in place for 3 years.         Marland Kitchen. HYDROcodone-acetaminophen (LORTAB) 7.5-500 MG/15ML solution   Oral   Take 15 mLs by mouth every 6 (six) hours as needed for pain.   120 mL   0   . ibuprofen (ADVIL,MOTRIN) 200 MG tablet   Oral   Take 600 mg by mouth every 6 (six) hours as needed. pain         . ibuprofen (ADVIL,MOTRIN) 600 MG tablet   Oral   Take 1 tablet (600 mg total) by mouth every 6 (six) hours as needed for pain.   30 tablet   0   . ipratropium (ATROVENT) 0.06 % nasal  spray   Each Nare   Place 2 sprays into both nostrils 4 (four) times daily.   15 mL   1    BP 106/67  Pulse 75  Temp(Src) 98.6 F (37 C) (Oral)  Resp 17  SpO2 97% Physical Exam  ED Course  Procedures (including critical care time) Labs Review Labs Reviewed - No data to display Imaging Review No results found.   MDM   1. URI (upper respiratory infection)        Linna HoffJames D Kindl, MD 01/09/14 [phone]

## 2014-01-09 NOTE — ED Notes (Signed)
Pt  Reports  Symptoms  Of  Cough  /  Congestion       Runny  Nose  X  5  Days     She  Is  Sitting  Upright on exam tavble   In no  Acute  Distress   Speaking in  Complete  sentances            sibling ill  As  Well

## 2014-01-09 NOTE — Discharge Instructions (Signed)
Drink plenty of fluids as discussed, use medicine as prescribed, and mucinex or delsym for cough. Return or see your doctor if further problems °

## 2014-01-10 ENCOUNTER — Encounter (HOSPITAL_COMMUNITY): Payer: Self-pay | Admitting: Emergency Medicine

## 2014-01-10 ENCOUNTER — Emergency Department (HOSPITAL_COMMUNITY)
Admission: EM | Admit: 2014-01-10 | Discharge: 2014-01-10 | Disposition: A | Discharge status: Home / Self Care | Payer: Medicaid Other | Attending: Emergency Medicine | Admitting: Emergency Medicine

## 2014-01-10 DIAGNOSIS — F17200 Nicotine dependence, unspecified, uncomplicated: Secondary | ICD-10-CM | POA: Insufficient documentation

## 2014-01-10 DIAGNOSIS — K920 Hematemesis: Secondary | ICD-10-CM | POA: Insufficient documentation

## 2014-01-10 DIAGNOSIS — Z79899 Other long term (current) drug therapy: Secondary | ICD-10-CM | POA: Insufficient documentation

## 2014-01-10 DIAGNOSIS — Z3202 Encounter for pregnancy test, result negative: Secondary | ICD-10-CM | POA: Insufficient documentation

## 2014-01-10 DIAGNOSIS — Z880 Allergy status to penicillin: Secondary | ICD-10-CM | POA: Insufficient documentation

## 2014-01-10 DIAGNOSIS — Z8711 Personal history of peptic ulcer disease: Secondary | ICD-10-CM | POA: Insufficient documentation

## 2014-01-10 LAB — CBC WITH DIFFERENTIAL/PLATELET
BASOS ABS: 0 10*3/uL (ref 0.0–0.1)
BASOS PCT: 0 % (ref 0–1)
EOS ABS: 0.1 10*3/uL (ref 0.0–0.7)
Eosinophils Relative: 2 % (ref 0–5)
HCT: 39.4 % (ref 36.0–46.0)
Hemoglobin: 13.1 g/dL (ref 12.0–15.0)
LYMPHS PCT: 11 % — AB (ref 12–46)
Lymphs Abs: 0.9 10*3/uL (ref 0.7–4.0)
MCH: 30.8 pg (ref 26.0–34.0)
MCHC: 33.2 g/dL (ref 30.0–36.0)
MCV: 92.7 fL (ref 78.0–100.0)
MONO ABS: 0.7 10*3/uL (ref 0.1–1.0)
Monocytes Relative: 8 % (ref 3–12)
Neutro Abs: 6.6 10*3/uL (ref 1.7–7.7)
Neutrophils Relative %: 79 % — ABNORMAL HIGH (ref 43–77)
PLATELETS: 207 10*3/uL (ref 150–400)
RBC: 4.25 MIL/uL (ref 3.87–5.11)
RDW: 13.5 % (ref 11.5–15.5)
WBC: 8.3 10*3/uL (ref 4.0–10.5)

## 2014-01-10 LAB — COMPREHENSIVE METABOLIC PANEL
ALBUMIN: 3.8 g/dL (ref 3.5–5.2)
ALK PHOS: 75 U/L (ref 39–117)
ALT: 12 U/L (ref 0–35)
AST: 20 U/L (ref 0–37)
BUN: 10 mg/dL (ref 6–23)
CHLORIDE: 103 meq/L (ref 96–112)
CO2: 23 mEq/L (ref 19–32)
Calcium: 9.6 mg/dL (ref 8.4–10.5)
Creatinine, Ser: 0.63 mg/dL (ref 0.50–1.10)
GFR calc Af Amer: >90 mL/min (ref >90)
GFR calc non Af Amer: >90 mL/min (ref >90)
GLUCOSE: 97 mg/dL (ref 70–99)
POTASSIUM: 4.3 meq/L (ref 3.7–5.3)
Sodium: 139 mEq/L (ref 137–147)
Total Protein: 7.4 g/dL (ref 6.0–8.3)

## 2014-01-10 LAB — URINALYSIS, ROUTINE W REFLEX MICROSCOPIC
BILIRUBIN URINE: NEGATIVE
GLUCOSE, UA: NEGATIVE mg/dL
Hgb urine dipstick: NEGATIVE
KETONES UR: NEGATIVE mg/dL
NITRITE: NEGATIVE
Protein, ur: NEGATIVE mg/dL
Specific Gravity, Urine: 1.008 (ref 1.005–1.030)
Urobilinogen, UA: 0.2 mg/dL (ref 0.0–1.0)
pH: 7.5 (ref 5.0–8.0)

## 2014-01-10 LAB — URINE MICROSCOPIC-ADD ON

## 2014-01-10 LAB — POC URINE PREG, ED: Preg Test, Ur: NEGATIVE

## 2014-01-10 LAB — POC OCCULT BLOOD, ED: Fecal Occult Bld: NEGATIVE

## 2014-01-10 MED ORDER — SUCRALFATE 1 G PO TABS: 1.0000 g | ORAL_TABLET | Freq: Three times a day (TID) | ORAL | Status: DC

## 2014-01-10 MED ORDER — PANTOPRAZOLE SODIUM 40 MG IV SOLR
40.0000 mg | Freq: Once | INTRAVENOUS | Status: AC
  Administered 2014-01-10: 40 mg via INTRAVENOUS
  Filled 2014-01-10: qty 40

## 2014-01-10 MED ORDER — ESOMEPRAZOLE MAGNESIUM 40 MG PO CPDR: 40.0000 mg | DELAYED_RELEASE_CAPSULE | Freq: Every day | ORAL | Status: DC

## 2014-01-10 MED ORDER — SODIUM CHLORIDE 0.9 % IV BOLUS (SEPSIS)
1000.0000 mL | Freq: Once | INTRAVENOUS | Status: AC
  Administered 2014-01-10: 1000 mL via INTRAVENOUS

## 2014-01-10 MED ORDER — ONDANSETRON HCL 4 MG/2ML IJ SOLN
4.0000 mg | Freq: Once | INTRAMUSCULAR | Status: AC
  Administered 2014-01-10: 4 mg via INTRAVENOUS
  Filled 2014-01-10: qty 2

## 2014-01-10 MED ORDER — GI COCKTAIL ~~LOC~~
30.0000 mL | Freq: Once | ORAL | Status: AC
  Administered 2014-01-10: 30 mL via ORAL
  Filled 2014-01-10: qty 30

## 2014-01-10 NOTE — ED Notes (Signed)
Pt reports having upper congestion and bronchitis recently, then began vomiting blood today. No acute distress noted at triage.

## 2014-01-10 NOTE — Discharge Instructions (Signed)
You had no abnormalities on your labs. You will need to see a gastroenterologist if you continue to have symptoms. Avoid spicy foods, alcohol and NSAIDs (as discussed). Read the information below carefully.  Hematemesis This condition is the vomiting of blood. CAUSES  This can happen if you have a peptic ulcer or an irritation of the throat, stomach, or small bowel. Vomiting over and over again or swallowing blood from a nosebleed, coughing or facial injury can also result in bloody vomit. Anti-inflammatory pain medicines are a common cause of this potentially dangerous condition. The most serious causes of vomiting blood include:  Ulcers (a bacteria called H. pylori is common cause of ulcers).  Clotting problems.  Alcoholism.  Cirrhosis. TREATMENT  Treatment depends on the cause and the severity of the bleeding. Small amounts of blood streaks in the vomit is not the same as vomiting large amounts of bloody or dark, coffee grounds-like material. Weakness, fainting, dehydration, anemia, and continued alcohol or drug use increase the risk. Examination may include blood, vomit, or stool tests. The presence of bloody or dark stool that tests positive for blood (Hemoccult) means the bleeding has been going on for some time. Endoscopy and imaging studies may be done. Emergency treatment may include:  IV medicines or fluids.  Blood transfusions.  Surgery. Hospital care is required for high risk patients or when IV fluids or blood is needed. Upper GI bleeding can cause shock and death if not controlled. HOME CARE INSTRUCTIONS   Your treatment does not require hospital care at this time.  Remain at rest until your condition improves.  Drink clear liquids as tolerated.  Avoid:  Alcohol.  Nicotine.  Aspirin.  Any other anti-inflammatory medicine (ibuprofen, naproxen, and many others).  Medications to suppress stomach acid or vomiting may be needed. Take all your medicine as  prescribed.  Be sure to see your caregiver for follow-up as recommended. SEEK IMMEDIATE MEDICAL CARE IF:   You have repeated vomiting, dehydration, fainting, or extreme weakness.  You are vomiting large amounts of bloody or dark material.  You pass large, dark or bloody stools. Document Released: 11/22/2004 Document Revised: 01/07/2012 Document Reviewed: 12/08/2008 [facility] Patient Information 2014 [place], [place].  Mallory-Weiss Syndrome Mallory-Weiss syndrome refers to bleeding from tears in the lining of the esophagus near where it meets the stomach. This is often caused by forceful vomiting, retching or coughing. This condition is often associated with alcoholism. Usually the bleeding stops by itself after 24 to 48 hours. Sometimes endoscopic or surgical treatment is needed. This condition is not usually fatal. SYMPTOMS  Vomiting of bright red or black coffee ground like material.  Black, tarry stools.  Low blood pressure causing you to feel faint or experience loss of consciousness. DIAGNOSIS  Definitive diagnosis is by endoscopy. Treatment is usually supportive. Persistent bleeding is uncommon. Sometimes cauterization or injection of epinephrine to stop the bleeding is used during the diagnostic endoscopy. Embolization (obstruction) of the arteries supplying the area of bleeding is sometimes used to stop the bleeding.  An NG tube (naso-gastric tube) may be inserted to determine where the bleeding is coming from.  Often an EGD (esophagogastroduodenoscopy) is done. In this procedure there is a small flexible tube-like telescope (endoscope) put into your mouth, through your esophagus (the food tube leading from your mouth to your stomach), down into your stomach and into the small bowel. Through this your caregiver can see what and where the problem is. TREATMENT  It is necessary to stop the bleeding  as soon as possible. During the EGD, your caregiver may inject medication into  bleeding vessels to clot them.  SEEK IMMEDIATE MEDICAL CARE IF:  You have persistent dizziness, lightheadedness, or fainting.  Your vomiting returns and you have blood in your stools.  You have vomit that is bright red blood or black coffee ground-like blood, bright red blood in the stool or black tarry stools.  You have chest pain.  You cannot eat or drink.  You have nausea or vomiting. MAKE SURE YOU:   Understand these instructions.  Will watch your condition.  Will get help right away if you are not doing well or get worse. Document Released: 03/04/2006 Document Revised: 01/07/2012 Document Reviewed: 04/08/2006 [facility] Patient Information 2014 [place], [place]. Sucralfate tablets What is this medicine? SUCRALFATE (SOO kral fate) helps to treat ulcers of the intestine. This medicine may be used for other purposes; ask your health care provider or pharmacist if you have questions. COMMON BRAND NAME(S): Carafate What should I tell my health care provider before I take this medicine? They need to know if you have any of these conditions: -kidney disease -an unusual or allergic reaction to sucralfate, other medicines, foods, dyes, or preservatives -pregnant or trying to get pregnant -breast-feeding How should I use this medicine? Take this medicine by mouth with a glass of water. Follow the directions on the prescription label. This medicine works best if you take it on an empty stomach, 1 hour before meals. Take your doses at regular intervals. Do not take your medicine more often than directed. Do not stop taking except on your doctor's advice. Talk to your pediatrician regarding the use of this medicine in children. Special care may be needed. Overdosage: If you think you have taken too much of this medicine contact a poison control center or emergency room at once. NOTE: This medicine is only for you. Do not share this medicine with others. What if I miss a dose? If you miss  a dose, take it as soon as you can. If it is almost time for your next dose, take only that dose. Do not take double or extra doses. What may interact with this medicine? -antacid -cimetidine -digoxin -ketoconazole -phenytoin -quinidine -ranitidine -some antibiotics like ciprofloxacin, norfloxacin, and ofloxacin -theophylline -thyroid hormones -warfarin This list may not describe all possible interactions. Give your health care provider a list of all the medicines, herbs, non-prescription drugs, or dietary supplements you use. Also tell them if you smoke, drink alcohol, or use illegal drugs. Some items may interact with your medicine. What should I watch for while using this medicine? Visit your doctor or health care professional for regular check ups. Let your doctor know if your symptoms do not improve or if you feel worse. Antacids should not be taken within one half hour before or after this medicine. What side effects may I notice from receiving this medicine? Side effects that you should report to your doctor or health care professional as soon as possible: -allergic reactions like skin rash, itching or hives, swelling of the face, lips, or tongue -difficulty breathing Side effects that usually do not require medical attention (report to your doctor or health care professional if they continue or are bothersome): -back pain -constipation -drowsy, dizzy -dry mouth -headache -stomach upset, gas -trouble sleeping This list may not describe all possible side effects. Call your doctor for medical advice about side effects. You may report side effects to FDA at 1-800-FDA-1088. Where should I keep my  medicine? Keep out of the reach of children. Store at room temperature between 15 and 30 degrees C (59 and 86 degrees F). Keep container tightly closed. Throw away any unused medicine after the expiration date. NOTE: This sheet is a summary. It may not cover all possible information. If  you have questions about this medicine, talk to your doctor, pharmacist, or health care provider.  2014, Elsevier/Gold Standard. (2008-06-16 15:46:20)  Esomeprazole capsules What is this medicine? ESOMEPRAZOLE (es oh ME pray zol) prevents the production of acid in the stomach. It is used to treat gastroesophageal reflux disease (GERD), ulcers, certain bacteria in the stomach, and inflammation of the esophagus. It can also be used to prevent ulcers in patients taking medicines called NSAIDs. You can also buy this medicine without a prescription to treat the symptoms of heartburn. The non-prescription product is not for long-term use, unless otherwise directed by your doctor or health care professional. This medicine may be used for other purposes; ask your health care provider or pharmacist if you have questions. COMMON BRAND NAME(S): Nexium What should I tell my health care provider before I take this medicine? They need to know if you have any of these conditions: -bloody or black, tarry stools -chest pain -have had heartburn for over 3 months -have heartburn with dizziness, lightheadedness, or sweating -liver disease -low levels of magnesium in the blood -nausea, vomiting -stomach pain -trouble swallowing -unexplained weight loss -vomiting with blood -wheezing -an unusual or allergic reaction to esomeprazole, other medicines, foods, dyes, or preservatives -pregnant or trying to get pregnant -breast-feeding How should I use this medicine? Take this medicine by mouth. Swallow the capsules whole with a drink of water. Follow the directions on the prescription or product label. Do not crush, break or chew. The capsules can be opened and the contents sprinkled on applesauce. Do not crush the contents into the food. This medicine works best if taken on an empty stomach at least one hour before a meal. Take your medicine at regular intervals. Do not take your medicine more often than  directed. Talk to your pediatrician regarding the use of this medicine in children. Special care may be needed. Overdosage: If you think you have taken too much of this medicine contact a poison control center or emergency room at once. NOTE: This medicine is only for you. Do not share this medicine with others. What if I miss a dose? If you miss a dose, take it as soon as you can. If it is almost time for your next dose, take only that dose. Do not take double or extra doses. What may interact with this medicine? Do not take this medicine with any of the following medications: -atazanavir This medicine may also interact with the following medications: -ampicillin -antiviral medicines for HIV or AIDS -certain medicines for fungal infections like ketoconazole and itraconazole -cilostazol -clopidogrel -diazepam -digoxin -erlotinib -diuretics -iron salts -methotrexate -St. John's Wort -tacrolimus -rifampin -warfarin This list may not describe all possible interactions. Give your health care provider a list of all the medicines, herbs, non-prescription drugs, or dietary supplements you use. Also tell them if you smoke, drink alcohol, or use illegal drugs. Some items may interact with your medicine. What should I watch for while using this medicine? If you are taking this medicine without a prescription, it may take 1 to 4 days for it to fully relieve your heartburn.  If you are using this medicine with a prescription from your healthcare professional for a  more serious condition, it can take several days before your condition gets better. Check with your doctor or health care professional if your condition does not start to get better, or if it gets worse. If you take this medicine for long periods of time, you may need blood work done. What side effects may I notice from receiving this medicine? Side effects that you should report to your doctor or health care professional as soon as  possible: -allergic reactions like skin rash, itching or hives, swelling of the face, lips, or tongue -bone, muscle or joint pain -breathing problems -chest pain or chest tightness -dark yellow or brown urine -fast, irregular heartbeat -feeling faint or lightheaded -fever or sore throat -muscle spasms -tremors -unusual bleeding or bruising -unusually weak or tired -upset stomach -yellowing of the eyes or skin Side effects that usually do not require medical attention (Report these to your doctor or health care professional if they continue or are bothersome.): -constipation -diarrhea -dry mouth -headache -nausea -stomach pain or gas -vomiting This list may not describe all possible side effects. Call your doctor for medical advice about side effects. You may report side effects to FDA at 1-800-FDA-1088. Where should I keep my medicine? Keep out of the reach of children. Store at room temperature between 15 and 30 degrees C (59 and 86 degrees F). Protect from light and moisture. Throw away any unused medicine after the expiration date. NOTE: This sheet is a summary. It may not cover all possible information. If you have questions about this medicine, talk to your doctor, pharmacist, or health care provider.  2014, Elsevier/Gold Standard. (2013-01-27 14:58:20) Peptic Ulcer A peptic ulcer is a sore in the lining of in your esophagus (esophageal ulcer), stomach (gastric ulcer), or in the first part of your small intestine (duodenal ulcer). The ulcer causes erosion into the deeper tissue. CAUSES  Normally, the lining of the stomach and the small intestine protects itself from the acid that digests food. The protective lining can be damaged by:  An infection caused by a bacterium called Helicobacter pylori (H. pylori).  Regular use of nonsteroidal anti-inflammatory drugs (NSAIDs), such as ibuprofen or aspirin.  Smoking tobacco. Other risk factors include being older than 50,  drinking alcohol excessively, and having a family history of ulcer disease.  SYMPTOMS   Burning pain or gnawing in the area between the chest and the belly button.  Heartburn.  Nausea and vomiting.  Bloating. The pain can be worse on an empty stomach and at night. If the ulcer results in bleeding, it can cause:  Black, tarry stools.  Vomiting of bright red blood.  Vomiting of coffee ground looking materials. DIAGNOSIS  A diagnosis is usually made based upon your history and an exam. Other tests and procedures may be performed to find the cause of the ulcer. Finding a cause will help determine the best treatment. Tests and procedures may include:  Blood tests, stool tests, or breath tests to check for the bacterium H. pylori.  An upper gastrointestinal (GI) series of the esophagus, stomach, and small intestine.  An endoscopy to examine the esophagus, stomach, and small intestine.  A biopsy. TREATMENT  Treatment may include:  Eliminating the cause of the ulcer, such as smoking, NSAIDs, or alcohol.  Medicines to reduce the amount of acid in your digestive tract.  Antibiotic medicines if the ulcer is caused by the H. pylori bacterium.  An upper endoscopy to treat a bleeding ulcer.  Surgery if the bleeding is  severe or if the ulcer created a hole somewhere in the digestive system. HOME CARE INSTRUCTIONS   Avoid tobacco, alcohol, and caffeine. Smoking can increase the acid in the stomach, and continued smoking will impair the healing of ulcers.  Avoid foods and drinks that seem to cause discomfort or aggravate your ulcer.  Only take medicines as directed by your caregiver. Do not substitute over-the-counter medicines for prescription medicines without talking to your caregiver.  Keep any follow-up appointments and tests as directed. SEEK MEDICAL CARE IF:   Your do not improve within 7 days of starting treatment.  You have ongoing indigestion or heartburn. SEEK IMMEDIATE  MEDICAL CARE IF:   You have sudden, sharp, or persistent abdominal pain.  You have bloody or dark black, tarry stools.  You vomit blood or vomit that looks like coffee grounds.  You become light headed, weak, or feel faint.  You become sweaty or clammy. MAKE SURE YOU:   Understand these instructions.  Will watch your condition.  Will get help right away if you are not doing well or get worse. Document Released: 10/12/2000 Document Revised: 07/09/2012 Document Reviewed: 05/14/2012 [facility] Patient Information 2014 [place], [place].

## 2014-01-10 NOTE — ED Provider Notes (Signed)
Medical screening examination/treatment/procedure(s) were performed by non-physician practitioner and as supervising physician I was immediately available for consultation/collaboration.   EKG Interpretation None       Doug SouSam Pranish Akhavan, MD 01/10/14 [phone]

## 2014-01-10 NOTE — ED Provider Notes (Signed)
CSN: [id number]     Arrival date & time 01/09/14  1223 History   First MD Initiated Contact with Patient 01/09/14 1246     Chief Complaint  Patient presents with  . URI   (Consider location/radiation/quality/duration/timing/severity/associated sxs/prior Treatment) HPI  Past Medical History  Diagnosis Date  . No pertinent past medical history    Past Surgical History  Procedure Laterality Date  . Laproscopy    . Ovarian cyst drainage    . Ovarian cyst drainage     No family history on file. History  Substance Use Topics  . Smoking status: Current Every Day Smoker -- 0.50 packs/day  . Smokeless tobacco: Not on file  . Alcohol Use: No   OB History   Grav Para Term Preterm Abortions TAB SAB Ect Mult Living   3 1 1  0 2 0 2 0 0 1     Review of Systems  Allergies  Amoxicillin and Prednisone  Home Medications   Current Outpatient Rx  Name  Route  Sig  Dispense  Refill  . Aspirin-Acetaminophen (GOODY BODY PAIN) 500-325 MG PACK   Oral   Take 1 packet by mouth daily as needed (headache). Pain         . chlorpheniramine-HYDROcodone (TUSSIONEX PENNKINETIC ER) 10-8 MG/5ML LQCR   Oral   Take 5 mLs by mouth every 12 (twelve) hours as needed for cough.   115 mL   0   . esomeprazole (NEXIUM) 40 MG capsule   Oral   Take 1 capsule (40 mg total) by mouth daily.   30 capsule   0   . ipratropium (ATROVENT) 0.06 % nasal spray   Each Nare   Place 2 sprays into both nostrils 4 (four) times daily.   15 mL   1   . sucralfate (CARAFATE) 1 G tablet   Oral   Take 1 tablet (1 g total) by mouth 4 (four) times daily -  with meals and at bedtime.   60 tablet   0    BP 106/67  Pulse 75  Temp(Src) 98.6 F (37 C) (Oral)  Resp 17  SpO2 97% Physical Exam  ED Course  Procedures (including critical care time) Labs Review Labs Reviewed - No data to display Imaging Review No results found.   MDM   1. URI (upper respiratory infection)        Linna HoffJames D Zeeshan Korte,  MD 01/10/14 2009

## 2014-01-10 NOTE — ED Provider Notes (Signed)
CSN: [id number]     Arrival date & time 01/10/14  1140 History   First MD Initiated Contact with Patient 01/10/14 1213     Chief Complaint  Patient presents with  . Hematemesis     (Consider location/radiation/quality/duration/timing/severity/associated sxs/prior Treatment) HPI  Melinda Mccoy Is a 28 year old female who presents the emergency department with chief complaint of hematemesis.  The patient was seen yesterday at [facility] in urgent care and diagnosed with URI.  She was discharged with Tussionex suspension however she states she did not fill this medication and has been using over-the-counter Deltasone and Alka-Seltzer for relief of her cold symptoms.  Patient also has a previous history of gastric ulcer which she was diagnosed with several months ago.  She was taking PPIs at this time however she no longer takes them.  She does admit to a history of smoking half pack daily, she has 1-2 beers 2-3 times a week, and she uses NSAIDs including Goody's powders, Aleve, very frequently.  Past Medical History  Diagnosis Date  . No pertinent past medical history    Past Surgical History  Procedure Laterality Date  . Laproscopy    . Ovarian cyst drainage    . Ovarian cyst drainage     History reviewed. No pertinent family history. History  Substance Use Topics  . Smoking status: Current Every Day Smoker -- 0.50 packs/day  . Smokeless tobacco: Not on file  . Alcohol Use: No   OB History   Grav Para Term Preterm Abortions TAB SAB Ect Mult Living   3 1 1  0 2 0 2 0 0 1     Review of Systems  Ten systems reviewed and are negative for acute change, except as noted in the HPI.   Allergies  Amoxicillin and Prednisone  Home Medications   Current Outpatient Rx  Name  Route  Sig  Dispense  Refill  . Aspirin-Acetaminophen (GOODY BODY PAIN) 500-325 MG PACK   Oral   Take 1 packet by mouth daily as needed (headache). Pain         . ipratropium (ATROVENT) 0.06 % nasal  spray   Each Nare   Place 2 sprays into both nostrils 4 (four) times daily.   15 mL   1   . chlorpheniramine-HYDROcodone (TUSSIONEX PENNKINETIC ER) 10-8 MG/5ML LQCR   Oral   Take 5 mLs by mouth every 12 (twelve) hours as needed for cough.   115 mL   0    BP 111/76  Pulse 88  Temp(Src) 97.9 F (36.6 C) (Oral)  Resp 18  SpO2 98%  LMP 12/04/2013 Physical Exam  Physical Exam  Nursing note and vitals reviewed. Constitutional: She is oriented to person, place, and time. She appears well-developed and well-nourished. No distress.  HENT:  Head: Normocephalic and atraumatic.  Eyes: Conjunctivae normal and EOM are normal. Pupils are equal, round, and reactive to light. No scleral icterus.  Neck: Normal range of motion.  Cardiovascular: Normal rate, regular rhythm and normal heart sounds.  Exam reveals no gallop and no friction rub.   No murmur heard. Pulmonary/Chest: Effort normal and breath sounds normal. No respiratory distress.  Abdominal: Soft. Bowel sounds are normal. She exhibits no distension and no mass. There is no tenderness. There is no guarding.  Neurological: She is alert and oriented to person, place, and time.  Skin: Skin is warm and dry. She is not diaphoretic.  Digital Rectal Exam reveals sphincter with good tone. No external hemorrhoids. No  masses or fissures. Stool color is brown with no overt blood.   ED Course  Procedures (including critical care time) Labs Review Labs Reviewed  URINALYSIS, ROUTINE W REFLEX MICROSCOPIC - Abnormal; Notable for the following:    Leukocytes, UA TRACE (*)    All other components within normal limits  COMPREHENSIVE METABOLIC PANEL - Abnormal; Notable for the following:    Total Bilirubin <0.2 (*)    All other components within normal limits  CBC WITH DIFFERENTIAL - Abnormal; Notable for the following:    Neutrophils Relative % 79 (*)    Lymphocytes Relative 11 (*)    All other components within normal limits  URINE  MICROSCOPIC-ADD ON - Abnormal; Notable for the following:    Squamous Epithelial / LPF FEW (*)    Bacteria, UA FEW (*)    All other components within normal limits  OCCULT BLOOD X 1 CARD TO LAB, STOOL  POC URINE PREG, ED  POC OCCULT BLOOD, ED   Imaging Review No results found.   EKG Interpretation None      MDM   Final diagnoses:  Hematemesis    Patient with normal labs.  She has what appears to be some vaginal contamination of urinary contents.  Her hemoglobin is normal.  Pain is reduced with medications given here in the ED.  Feel patient will need to follow up with GI specialist she continues to have the symptoms.  Discharge patient with a PPI and Carafate.  I have altered discussed with the patient and gastric lining irritants and advised against spicy foods, alcohol, NSAIDs.  Advised taking PPI and using Carafate for pain relief.  Patient also had no repeat vomiting episodes here in the ED.  She appears safe for discharge at this time.    Arthor Captain, PA-C 01/10/14 1557

## 2014-04-26 ENCOUNTER — Emergency Department (HOSPITAL_COMMUNITY)
Admission: EM | Admit: 2014-04-26 | Discharge: 2014-04-26 | Disposition: A | Discharge status: Home / Self Care | Payer: Medicaid Other | Source: Home / Self Care

## 2014-04-26 ENCOUNTER — Encounter (HOSPITAL_COMMUNITY): Payer: Self-pay | Admitting: Emergency Medicine

## 2014-04-26 ENCOUNTER — Emergency Department (INDEPENDENT_AMBULATORY_CARE_PROVIDER_SITE_OTHER): Payer: Medicaid Other

## 2014-04-26 DIAGNOSIS — R112 Nausea with vomiting, unspecified: Secondary | ICD-10-CM

## 2014-04-26 DIAGNOSIS — E860 Dehydration: Secondary | ICD-10-CM

## 2014-04-26 LAB — POCT URINALYSIS DIP (DEVICE)
Glucose, UA: NEGATIVE mg/dL
HGB URINE DIPSTICK: NEGATIVE
KETONES UR: 15 mg/dL — AB
LEUKOCYTES UA: NEGATIVE
Nitrite: NEGATIVE
PROTEIN: 30 mg/dL — AB
Specific Gravity, Urine: >=1.03 (ref 1.005–1.030)
Urobilinogen, UA: 1 mg/dL (ref 0.0–1.0)
pH: 6 (ref 5.0–8.0)

## 2014-04-26 LAB — POCT I-STAT, CHEM 8
BUN: 15 mg/dL (ref 6–23)
Calcium, Ion: 1.18 mmol/L (ref 1.12–1.23)
Chloride: 101 mEq/L (ref 96–112)
Creatinine, Ser: 0.8 mg/dL (ref 0.50–1.10)
GLUCOSE: 102 mg/dL — AB (ref 70–99)
HEMATOCRIT: 46 % (ref 36.0–46.0)
Hemoglobin: 15.6 g/dL — ABNORMAL HIGH (ref 12.0–15.0)
POTASSIUM: 3.4 meq/L — AB (ref 3.7–5.3)
Sodium: 138 mEq/L (ref 137–147)
TCO2: 24 mmol/L (ref 0–100)

## 2014-04-26 LAB — POCT PREGNANCY, URINE: Preg Test, Ur: NEGATIVE

## 2014-04-26 MED ORDER — ONDANSETRON HCL 4 MG PO TABS: 4.0000 mg | ORAL_TABLET | Freq: Four times a day (QID) | ORAL | Status: DC

## 2014-04-26 MED ORDER — ONDANSETRON 4 MG PO TBDP
ORAL_TABLET | ORAL | Status: AC
  Filled 2014-04-26: qty 1

## 2014-04-26 MED ORDER — SODIUM CHLORIDE 0.9 % IV SOLN: Freq: Once | INTRAVENOUS | Status: DC

## 2014-04-26 MED ORDER — ONDANSETRON 4 MG PO TBDP
4.0000 mg | ORAL_TABLET | Freq: Once | ORAL | Status: AC
  Administered 2014-04-26: 4 mg via ORAL

## 2014-04-26 NOTE — ED Provider Notes (Signed)
CSN: [id number]     Arrival date & time 04/26/14  0801 History   First MD Initiated Contact with Patient 04/26/14 0820     Chief Complaint  Patient presents with  . Emesis   (Consider location/radiation/quality/duration/timing/severity/associated sxs/prior Treatment) HPI Comments: 28 year old female vomiting too numerous to count for the past 48 hours. She has been feeling hot alternating with cold. She reports a fever 101.2 last night. She complains of abdominal pain, weakness, sweating and 0 by mouth intake. States her boyfriend had vomiting yesterday and lasted for about 24 hours or less. Her son had a 24-hour episode of vomiting last week before improving.   Past Medical History  Diagnosis Date  . No pertinent past medical history    Past Surgical History  Procedure Laterality Date  . Laproscopy    . Ovarian cyst drainage    . Ovarian cyst drainage     History reviewed. No pertinent family history. History  Substance Use Topics  . Smoking status: Current Every Day Smoker -- 0.50 packs/day  . Smokeless tobacco: Not on file  . Alcohol Use: No   OB History   Grav Para Term Preterm Abortions TAB SAB Ect Mult Living   3 1 1  0 2 0 2 0 0 1     Review of Systems  Constitutional: Positive for fever, chills, activity change, appetite change and fatigue.  HENT: Negative.   Respiratory: Negative.   Cardiovascular: Negative for chest pain and palpitations.  Gastrointestinal: Positive for nausea, vomiting and abdominal pain. Negative for constipation, blood in stool and abdominal distention.  Genitourinary: Negative for dysuria, urgency, frequency, flank pain, vaginal bleeding, vaginal discharge, menstrual problem and pelvic pain.  Musculoskeletal: Negative.   Skin: Negative for rash.  Neurological: Positive for weakness. Negative for syncope and numbness.    Allergies  Amoxicillin and Prednisone  Home Medications   Prior to Admission medications   Medication Sig Start Date  End Date Taking? Authorizing Provider  Aspirin-Acetaminophen (GOODY BODY PAIN) 500-325 MG PACK Take 1 packet by mouth daily as needed (headache). Pain    Historical Provider, MD  chlorpheniramine-HYDROcodone (TUSSIONEX PENNKINETIC ER) 10-8 MG/5ML LQCR Take 5 mLs by mouth every 12 (twelve) hours as needed for cough. 01/09/14   Linna HoffJames D Kindl, MD  esomeprazole (NEXIUM) 40 MG capsule Take 1 capsule (40 mg total) by mouth daily. 01/10/14   Arthor CaptainAbigail Harris, PA-C  ipratropium (ATROVENT) 0.06 % nasal spray Place 2 sprays into both nostrils 4 (four) times daily. 01/09/14   Linna HoffJames D Kindl, MD  ondansetron (ZOFRAN) 4 MG tablet Take 1 tablet (4 mg total) by mouth every 6 (six) hours. 04/26/14   Hayden Rasmussenavid Mabe, NP  sucralfate (CARAFATE) 1 G tablet Take 1 tablet (1 g total) by mouth 4 (four) times daily -  with meals and at bedtime. 01/10/14   Abigail Harris, PA-C   BP 105/78  Pulse 74  Temp(Src) 97.4 F (36.3 C) (Oral)  Resp 16  SpO2 97%  LMP 04/03/2014 Physical Exam  Nursing note and vitals reviewed. Constitutional: She is oriented to person, place, and time. She appears well-developed and well-nourished. No distress.  Eyes: Conjunctivae and EOM are normal.  Neck: Normal range of motion. Neck supple.  Cardiovascular: Normal rate, regular rhythm and normal heart sounds.   Pulmonary/Chest: Effort normal and breath sounds normal. No respiratory distress.  Abdominal: Soft. Bowel sounds are normal. She exhibits no mass. There is tenderness. There is no rebound and no guarding.  Tenderness in the epigastrium and  left lower quadrant.  Musculoskeletal: She exhibits no edema.  Lymphadenopathy:    She has no cervical adenopathy.  Neurological: She is alert and oriented to person, place, and time. She exhibits normal muscle tone.  Skin: No rash noted. No erythema.  Skin warm, pale and clammy  Psychiatric: She has a normal mood and affect.    ED Course  Procedures (including critical care time) Labs Review Labs  Reviewed  POCT URINALYSIS DIP (DEVICE) - Abnormal; Notable for the following:    Bilirubin Urine SMALL (*)    Ketones, ur 15 (*)    Protein, ur 30 (*)    All other components within normal limits  POCT I-STAT, CHEM 8 - Abnormal; Notable for the following:    Potassium 3.4 (*)    Glucose, Bld 102 (*)    Hemoglobin 15.6 (*)    All other components within normal limits  POCT PREGNANCY, URINE    Imaging Review Dg Abd 1 View  04/26/2014   CLINICAL DATA:  Abdominal pain.  Nausea and vomiting.  EXAM: ABDOMEN - 1 VIEW  COMPARISON:  None.  FINDINGS: The bowel gas pattern is normal. No radio-opaque calculi or other significant radiographic abnormality are seen.  IMPRESSION: Negative.   Electronically Signed   By: Myles RosenthalJohn  Stahl M.D.   On: 04/26/2014 09:11     MDM   1. Nausea and vomiting, vomiting of unspecified type   2. Dehydration, mild     Mild dehydration zofran 4 mg po Pt declined IV st she had to go to work. Recommend she not, can get worse particularly in sun or hot environment.       Hayden Rasmussenavid Mabe, NP 04/26/14 [phone]

## 2014-04-26 NOTE — Discharge Instructions (Signed)
Dehydration, Adult °Dehydration is when you lose more fluids from the body than you take in. Vital organs like the kidneys, brain, and heart cannot function without a proper amount of fluids and salt. Any loss of fluids from the body can cause dehydration.  °CAUSES  °· Vomiting. °· Diarrhea. °· Excessive sweating. °· Excessive urine output. °· Fever. °SYMPTOMS  °Mild dehydration °· Thirst. °· Dry lips. °· Slightly dry mouth. °Moderate dehydration °· Very dry mouth. °· Sunken eyes. °· Skin does not bounce back quickly when lightly pinched and released. °· Dark urine and decreased urine production. °· Decreased tear production. °· Headache. °Severe dehydration °· Very dry mouth. °· Extreme thirst. °· Rapid, weak pulse (more than 100 beats per minute at rest). °· Cold hands and feet. °· Not able to sweat in spite of heat and temperature. °· Rapid breathing. °· Blue lips. °· Confusion and lethargy. °· Difficulty being awakened. °· Minimal urine production. °· No tears. °DIAGNOSIS  °Your caregiver will diagnose dehydration based on your symptoms and your exam. Blood and urine tests will help confirm the diagnosis. The diagnostic evaluation should also identify the cause of dehydration. °TREATMENT  °Treatment of mild or moderate dehydration can often be done at home by increasing the amount of fluids that you drink. It is best to drink small amounts of fluid more often. Drinking too much at one time can make vomiting worse. Refer to the home care instructions below. °Severe dehydration needs to be treated at the hospital where you will probably be given intravenous (IV) fluids that contain water and electrolytes. °HOME CARE INSTRUCTIONS  °· Ask your caregiver about specific rehydration instructions. °· Drink enough fluids to keep your urine clear or pale yellow. °· Drink small amounts frequently if you have nausea and vomiting. °· Eat as you normally do. °· Avoid: °¨ Foods or drinks high in sugar. °¨ Carbonated  drinks. °¨ Juice. °¨ Extremely hot or cold fluids. °¨ Drinks with caffeine. °¨ Fatty, greasy foods. °¨ Alcohol. °¨ Tobacco. °¨ Overeating. °¨ Gelatin desserts. °· Wash your hands well to avoid spreading bacteria and viruses. °· Only take over-the-counter or prescription medicines for pain, discomfort, or fever as directed by your caregiver. °· Ask your caregiver if you should continue all prescribed and over-the-counter medicines. °· Keep all follow-up appointments with your caregiver. °SEEK MEDICAL CARE IF: °· You have abdominal pain and it increases or stays in one area (localizes). °· You have a rash, stiff neck, or severe headache. °· You are irritable, sleepy, or difficult to awaken. °· You are weak, dizzy, or extremely thirsty. °SEEK IMMEDIATE MEDICAL CARE IF:  °· You are unable to keep fluids down or you get worse despite treatment. °· You have frequent episodes of vomiting or diarrhea. °· You have blood or green matter (bile) in your vomit. °· You have blood in your stool or your stool looks black and tarry. °· You have not urinated in 6 to 8 hours, or you have only urinated a small amount of very dark urine. °· You have a fever. °· You faint. °MAKE SURE YOU:  °· Understand these instructions. °· Will watch your condition. °· Will get help right away if you are not doing well or get worse. °Document Released: 10/15/2005 Document Revised: 01/07/2012 Document Reviewed: 06/04/2011 °ExitCare® Patient Information ©2015 [employer]. This information is not intended to replace advice given to you by your health care provider. Make sure you discuss any questions you have with your health care   provider. ° °Rehydration, Adult °Rehydration is the replacement of body fluids lost during dehydration. Dehydration is an extreme loss of body fluids to the point of body function impairment. There are many ways extreme fluid loss can occur, including vomiting, diarrhea, or excess sweating. Recovering from dehydration  requires replacing lost fluids, continuing to eat to maintain strength, and avoiding foods and beverages that may contribute to further fluid loss or may increase nausea. °HOW TO REHYDRATE °In most cases, rehydration involves the replacement of not only fluids but also carbohydrates and basic body salts. Rehydration with an oral rehydration solution is one way to replace essential nutrients lost through dehydration. °An oral rehydration solution can be purchased at pharmacies, retail stores, and online. Premixed packets of powder that you combine with water to make a solution are also sold. You can prepare an oral rehydration solution at home by mixing the following ingredients together:  °·  - tsp table salt. °· ¾ tsp baking soda. °·  tsp salt substitute containing potassium chloride. °· 1 tablespoons sugar. °· 1 L (34 oz) of water. °Be sure to use exact measurements. Including too much sugar can make diarrhea worse. °Drink ½-1 cup (120-240 mL) of oral rehydration solution each time you have diarrhea or vomit. If drinking this amount makes your vomiting worse, try drinking smaller amounts more often. For example, drink 1-3 tsp every 5-10 minutes.  °A general rule for staying hydrated is to drink 1½-2 L of fluid per day. Talk to your caregiver about the specific amount you should be drinking each day. Drink enough fluids to keep your urine clear or pale yellow. °EATING WHEN DEHYDRATED °Even if you have had severe sweating or you are having diarrhea, do not stop eating. Many healthy items in a normal diet are okay to continue eating while recovering from dehydration. The following tips can help you to lessen nausea when you eat: °· Ask someone else to prepare your food. Cooking smells may worsen nausea. °· Eat in a well-ventilated room away from cooking smells. °· Sit up when you eat. Avoid lying down until 1-2 hours after eating. °· Eat small amounts when you eat. °· Eat foods that are easy to digest. These include  soft, well-cooked, or mashed foods. °FOODS AND BEVERAGES TO AVOID °Avoid eating or drinking the following foods and beverages that may increase nausea or further loss of fluid:  °· Fruit juices with a high sugar content, such as concentrated juices. °· Alcohol. °· Beverages containing caffeine. °· Carbonated drinks. They may cause a lot of gas. °· Foods that may cause a lot of gas, such as cabbage, broccoli, and beans. °· Fatty, greasy, and fried foods. °· Spicy, very salty, and very sweet foods or drinks. °· Foods or drinks that are very hot or very cold. Consume food or drinks at or near room temperature. °· Foods that need a lot of chewing, such as raw vegetables. °· Foods that are sticky or hard to swallow, such as peanut butter. °Document Released: 01/07/2012 Document Revised: 07/09/2012 Document Reviewed: 01/07/2012 °ExitCare® Patient Information ©2015 [employer]. This information is not intended to replace advice given to you by your health care provider. Make sure you discuss any questions you have with your health care provider. ° °

## 2014-04-26 NOTE — ED Notes (Signed)
C/o  Vomiting for two days  States her son did have a stomach bug recently but only lasted for 24 hours States her boyfriend has started vomiting yesterday Has tried pepto as tx

## 2014-04-28 NOTE — ED Provider Notes (Signed)
Medical screening examination/treatment/procedure(s) were performed by non-physician practitioner and as supervising physician I was immediately available for consultation/collaboration.  Leslee Homeavid Nayleah Gamel, M.D.  Reuben Likesavid C Marvelyn Bouchillon, MD 04/28/14 [phone]

## 2014-08-30 ENCOUNTER — Encounter (HOSPITAL_COMMUNITY): Payer: Self-pay | Admitting: Emergency Medicine

## 2014-09-08 ENCOUNTER — Other Ambulatory Visit: Payer: Self-pay | Admitting: Obstetrics & Gynecology

## 2014-09-08 DIAGNOSIS — N6310 Unspecified lump in the right breast, unspecified quadrant: Secondary | ICD-10-CM

## 2014-09-10 ENCOUNTER — Ambulatory Visit
Admission: RE | Admit: 2014-09-10 | Discharge: 2014-09-10 | Disposition: A | Discharge status: Home / Self Care | Payer: Medicaid Other | Source: Ambulatory Visit | Attending: Obstetrics & Gynecology | Admitting: Obstetrics & Gynecology

## 2014-09-10 DIAGNOSIS — N6310 Unspecified lump in the right breast, unspecified quadrant: Secondary | ICD-10-CM

## 2014-10-13 ENCOUNTER — Other Ambulatory Visit: Payer: Self-pay

## 2014-10-15 LAB — CYTOLOGY - PAP

## 2016-06-14 ENCOUNTER — Emergency Department (HOSPITAL_BASED_OUTPATIENT_CLINIC_OR_DEPARTMENT_OTHER)
Admission: EM | Admit: 2016-06-14 | Discharge: 2016-06-14 | Disposition: A | Discharge status: Home / Self Care | Payer: Self-pay | Attending: Emergency Medicine | Admitting: Emergency Medicine

## 2016-06-14 ENCOUNTER — Encounter (HOSPITAL_BASED_OUTPATIENT_CLINIC_OR_DEPARTMENT_OTHER): Payer: Self-pay | Admitting: *Deleted

## 2016-06-14 ENCOUNTER — Emergency Department (HOSPITAL_BASED_OUTPATIENT_CLINIC_OR_DEPARTMENT_OTHER): Payer: Self-pay

## 2016-06-14 DIAGNOSIS — K5900 Constipation, unspecified: Secondary | ICD-10-CM

## 2016-06-14 DIAGNOSIS — R1111 Vomiting without nausea: Secondary | ICD-10-CM

## 2016-06-14 DIAGNOSIS — R197 Diarrhea, unspecified: Secondary | ICD-10-CM | POA: Insufficient documentation

## 2016-06-14 DIAGNOSIS — R1032 Left lower quadrant pain: Secondary | ICD-10-CM

## 2016-06-14 DIAGNOSIS — R112 Nausea with vomiting, unspecified: Secondary | ICD-10-CM | POA: Insufficient documentation

## 2016-06-14 DIAGNOSIS — F17200 Nicotine dependence, unspecified, uncomplicated: Secondary | ICD-10-CM | POA: Insufficient documentation

## 2016-06-14 LAB — CBC WITH DIFFERENTIAL/PLATELET
BASOS PCT: 0 %
Basophils Absolute: 0 10*3/uL (ref 0.0–0.1)
EOS ABS: 0.2 10*3/uL (ref 0.0–0.7)
Eosinophils Relative: 2 %
HEMATOCRIT: 39 % (ref 36.0–46.0)
HEMOGLOBIN: 12.9 g/dL (ref 12.0–15.0)
LYMPHS ABS: 2.4 10*3/uL (ref 0.7–4.0)
Lymphocytes Relative: 32 %
MCH: 30.6 pg (ref 26.0–34.0)
MCHC: 33.1 g/dL (ref 30.0–36.0)
MCV: 92.4 fL (ref 78.0–100.0)
MONO ABS: 0.6 10*3/uL (ref 0.1–1.0)
MONOS PCT: 7 %
NEUTROS PCT: 59 %
Neutro Abs: 4.6 10*3/uL (ref 1.7–7.7)
Platelets: 197 10*3/uL (ref 150–400)
RBC: 4.22 MIL/uL (ref 3.87–5.11)
RDW: 13.2 % (ref 11.5–15.5)
WBC: 7.8 10*3/uL (ref 4.0–10.5)

## 2016-06-14 LAB — URINALYSIS, DIPSTICK ONLY
Bilirubin Urine: NEGATIVE
GLUCOSE, UA: NEGATIVE mg/dL
HGB URINE DIPSTICK: NEGATIVE
KETONES UR: NEGATIVE mg/dL
Leukocytes, UA: NEGATIVE
Nitrite: NEGATIVE
PH: 5.5 (ref 5.0–8.0)
PROTEIN: NEGATIVE mg/dL
Specific Gravity, Urine: 1.015 (ref 1.005–1.030)

## 2016-06-14 LAB — COMPREHENSIVE METABOLIC PANEL
ALT: 13 U/L — AB (ref 14–54)
ANION GAP: 6 (ref 5–15)
AST: 20 U/L (ref 15–41)
Albumin: 4.6 g/dL (ref 3.5–5.0)
Alkaline Phosphatase: 50 U/L (ref 38–126)
BUN: 13 mg/dL (ref 6–20)
CHLORIDE: 104 mmol/L (ref 101–111)
CO2: 27 mmol/L (ref 22–32)
Calcium: 9.4 mg/dL (ref 8.9–10.3)
Creatinine, Ser: 0.66 mg/dL (ref 0.44–1.00)
GFR calc non Af Amer: >60 mL/min (ref >60)
Glucose, Bld: 118 mg/dL — ABNORMAL HIGH (ref 65–99)
Potassium: 4.3 mmol/L (ref 3.5–5.1)
SODIUM: 137 mmol/L (ref 135–145)
Total Bilirubin: 0.2 mg/dL — ABNORMAL LOW (ref 0.3–1.2)
Total Protein: 7.4 g/dL (ref 6.5–8.1)

## 2016-06-14 LAB — PREGNANCY, URINE: Preg Test, Ur: NEGATIVE

## 2016-06-14 MED ORDER — MORPHINE SULFATE (PF) 4 MG/ML IV SOLN: 4.0000 mg | Freq: Once | INTRAVENOUS | Status: DC

## 2016-06-14 MED ORDER — ONDANSETRON HCL 4 MG/2ML IJ SOLN
INTRAMUSCULAR | Status: AC
  Filled 2016-06-14: qty 2

## 2016-06-14 MED ORDER — OXYCODONE-ACETAMINOPHEN 5-325 MG PO TABS: 1.0000 | ORAL_TABLET | Freq: Three times a day (TID) | ORAL | 0 refills | Status: DC | PRN

## 2016-06-14 MED ORDER — ONDANSETRON HCL 4 MG/2ML IJ SOLN
4.0000 mg | Freq: Once | INTRAMUSCULAR | Status: AC
  Administered 2016-06-14: 4 mg via INTRAVENOUS

## 2016-06-14 MED ORDER — ONDANSETRON HCL 4 MG/2ML IJ SOLN
4.0000 mg | Freq: Once | INTRAMUSCULAR | Status: AC
  Administered 2016-06-14: 4 mg via INTRAVENOUS
  Filled 2016-06-14: qty 2

## 2016-06-14 MED ORDER — POLYETHYLENE GLYCOL 3350 17 GM/SCOOP PO POWD: 17.0000 g | Freq: Every day | ORAL | 0 refills | Status: DC

## 2016-06-14 MED ORDER — ONDANSETRON HCL 4 MG PO TABS: 4.0000 mg | ORAL_TABLET | Freq: Three times a day (TID) | ORAL | 0 refills | Status: DC | PRN

## 2016-06-14 MED ORDER — DICYCLOMINE HCL 20 MG PO TABS: 20.0000 mg | ORAL_TABLET | Freq: Two times a day (BID) | ORAL | 0 refills | Status: DC

## 2016-06-14 MED ORDER — SODIUM CHLORIDE 0.9 % IV BOLUS (SEPSIS)
1000.0000 mL | Freq: Once | INTRAVENOUS | Status: AC
  Administered 2016-06-14: 1000 mL via INTRAVENOUS

## 2016-06-14 NOTE — ED Notes (Signed)
Patient transported to CT 

## 2016-06-14 NOTE — ED Triage Notes (Signed)
Pt reports abdominal pain-mostly LLQ x 3 hours PTA.  Vomiting x 1-2.  Denies vaginal discharge.  Denies urinary symptoms and diarrhea.

## 2016-06-14 NOTE — ED Notes (Signed)
Pt verbalizes understanding of d/c instructions and strict return precautions for worsening condition.  She denies any further needs at this time.

## 2016-06-14 NOTE — ED Provider Notes (Signed)
[facility]Y [facility]PT Provider Note   CSN: [id number] Arrival date & time: 06/14/16  1547 By signing my name below, I, Levon HedgerElizabeth Hall, attest that this documentation has been prepared under the direction and in the presence of non-physician practitioner,  Danelle BerryLeisa Cloy Cozzens, PA-C  Electronically Signed: Levon HedgerElizabeth Hall, Scribe. 06/18/2016. 6:37 PM.   History   Chief Complaint Chief Complaint  Patient presents with  . Abdominal Pain    HPI Melinda Mccoy is a 30 y.o. female who presents to the Emergency Department complaining of constant LLQ pain with radiation to her left lower back which began this at 1 pm today. She describes the pain as sharp, 6/10 in severity, constant since it's onset, with intense waves of more severe pain. Pt states its is similar to, but worse than prior ovarian cyst.She notes associated nausea and vomiting x5.  Her LNMP was 05/18/16. Pt denies any risk for STI exposure. Pt has had one pregnancy which ended in a vaginal birth with no complications.  No hx of abdominal surgeries, no hx of kidney stone. Pt states her last bowel movement was this morning and was normal. Pt denies fever, diarrhea, constipation, dysuria, hematuria, or suprapubic pain.  No vaginal sx.    The history is provided by the patient. No language interpreter was used.    Past Medical History:  Diagnosis Date  . No pertinent past medical history     There are no active problems to display for this patient.   Past Surgical History:  Procedure Laterality Date  . laproscopy    . OVARIAN CYST DRAINAGE    . OVARIAN CYST DRAINAGE      OB History    Gravida Para Term Preterm AB Living   3 1 1  0 2 1   SAB TAB Ectopic Multiple Live Births   2 0 0 0         Home Medications    Prior to Admission medications   Medication Sig Start Date End Date Taking? Authorizing Provider  dicyclomine (BENTYL) 20 MG tablet Take 1 tablet (20 mg total) by mouth 2 (two) times daily. 06/14/16   Danelle BerryLeisa Avis Tirone, PA-C    ondansetron (ZOFRAN) 4 MG tablet Take 1 tablet (4 mg total) by mouth every 8 (eight) hours as needed for nausea or vomiting. 06/14/16   Danelle BerryLeisa Taryll Reichenberger, PA-C  oxyCODONE-acetaminophen (PERCOCET) 5-325 MG tablet Take 1-2 tablets by mouth every 8 (eight) hours as needed. 06/14/16   Danelle BerryLeisa Myeasha Ballowe, PA-C  polyethylene glycol powder (GLYCOLAX/MIRALAX) powder Take 17 g by mouth daily. 06/14/16   Danelle BerryLeisa Ayelen Sciortino, PA-C    Family History History reviewed. No pertinent family history.  Social History Social History  Substance Use Topics  . Smoking status: Current Every Day Smoker    Packs/day: 0.50  . Smokeless tobacco: Never Used  . Alcohol use No     Allergies   Amoxicillin and Prednisone   Review of Systems Review of Systems  All other systems reviewed and are negative.   Physical Exam Updated Vital Signs BP 121/90 (BP Location: Left Arm)   Pulse 81   Temp 98.1 F (36.7 C) (Oral)   Resp 16   Ht 5\' 4"  (1.626 m)   Wt 61.2 kg   LMP 05/21/2016   SpO2 100%   BMI 23.17 kg/m   Physical Exam  Constitutional: She is oriented to person, place, and time. She appears well-developed and well-nourished. No distress.  HENT:  Head: Normocephalic and atraumatic.  Nose: Nose normal.  Oral  mucosa dry  Eyes: Conjunctivae are normal. Pupils are equal, round, and reactive to light. Right eye exhibits no discharge. Left eye exhibits no discharge.  Neck: Normal range of motion. Neck supple.  Cardiovascular: Normal rate, regular rhythm, normal heart sounds and intact distal pulses.  Exam reveals no gallop and no friction rub.   No murmur heard. Pulmonary/Chest: Effort normal and breath sounds normal. No stridor. No respiratory distress. She has no wheezes. She has no rales. She exhibits no tenderness.  Abdominal: Soft. Bowel sounds are normal. She exhibits no distension and no mass. There is tenderness. There is guarding. There is no rebound.  LLQ tenderness with involuntary guarding  LUQ and CVA  tenderness No suprapubic tenderness   Genitourinary: Cervix exhibits no motion tenderness. Right adnexum displays no mass, no tenderness and no fullness. Left adnexum displays no mass, no tenderness and no fullness. No tenderness in the vagina. No vaginal discharge found.  Genitourinary Comments: Chaperone (scribe) was present for bimanual exam which was performed with no discomfort or complications.    Musculoskeletal: Normal range of motion.  Neurological: She is alert and oriented to person, place, and time. She exhibits normal muscle tone. Coordination normal.  Skin: Skin is warm and dry. Capillary refill takes less than 2 seconds. She is not diaphoretic. No pallor.  Psychiatric: She has a normal mood and affect. Her behavior is normal. Judgment and thought content normal.  Nursing note and vitals reviewed.   ED Treatments / Results  DIAGNOSTIC STUDIES:  Oxygen Saturation is 100% on RA, normal by my interpretation.    COORDINATION OF CARE:  6:34PM Will order CT renal stone study. Discussed treatment plan with pt at bedside and pt agreed to plan.   Labs normal(all labs ordered are listed, but only abnormal results are displayed) Labs Reviewed  COMPREHENSIVE METABOLIC PANEL - Abnormal; Notable for the following:       Result Value   Glucose, Bld 118 (*)    ALT 13 (*)    Total Bilirubin 0.2 (*)    All other components within normal limits  URINALYSIS, DIPSTICK ONLY  PREGNANCY, URINE  CBC WITH DIFFERENTIAL/PLATELET    EKG  EKG Interpretation None       Radiology   CT Renal Stone Study (Final result)  Result time 06/14/16 19:09:51  Final result by Richarda Overlie, MD (06/14/16 19:09:51)           Narrative:   CLINICAL DATA: Left flank pain. Pain in the left upper quadrant and left lower quadrant.  EXAM: CT ABDOMEN AND PELVIS WITHOUT CONTRAST  TECHNIQUE: Multidetector CT imaging of the abdomen and pelvis was performed following the standard protocol without IV  contrast.  COMPARISON: 05/15/2010  FINDINGS: Lower chest: Lung bases are clear.  Hepatobiliary: Normal appearance of the liver and gallbladder.  Pancreas: Normal appearance of the pancreas without inflammation or duct dilatation.  Spleen: Normal appearance of spleen without enlargement.  Adrenals/Urinary Tract: Normal adrenal glands. Normal appearance of both kidneys without stones or hydronephrosis. Normal appearance of the urinary bladder.  Stomach/Bowel: Normal appearance of the bowel structures. No evidence for obstruction. Large amount of stool throughout the colon.  Vascular/Lymphatic: No significant atherosclerotic calcifications in the arterial structures. No suspicious lymphadenopathy.  Reproductive: Limited evaluation of the uterus and adnexal structures without contrast. The left ovary is prominent for size measuring roughly 4.7 x 3.3 cm.  Other: No significant free fluid. No free air.  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 This is  a chronic finding. No anterolisthesis at L5-S1.  IMPRESSION: No acute abnormality in the abdomen or pelvis.  Large amount of stool in the colon. This may be contributing to patient's symptoms.  Asymmetry of the ovaries, left side greater than right. If this is the area of clinical concern, this could be better characterized with a pelvic ultrasound.   Electronically Signed By: Richarda OverlieAdam Henn M.D. On: 06/14/2016 19:09           Procedures Procedures (including critical care time)  Medications Ordered in ED Medications  ondansetron (ZOFRAN) injection 4 mg (4 mg Intravenous Given 06/14/16 1650)  sodium chloride 0.9 % bolus 1,000 mL (0 mLs Intravenous Stopped 06/14/16 1937)  ondansetron (ZOFRAN) injection 4 mg (4 mg Intravenous Given 06/14/16 1839)    Initial Impression / Assessment and Plan / ED Course  I have reviewed the triage vital signs and the nursing notes.  Pertinent labs & imaging results  that were available during my care of the patient were reviewed by me and considered in my medical decision making (see chart for details).  Clinical Course   Pt with LLQ abdominal pain with radiation to left flank/left low back with vomiting.  No adnexal tenderness on exam, no vaginal or urinary sx.  Generalized left sided abdominal pain with guarding, to rebound tenderness.  CT renal stone negative for nephrolithiasis, pertinent for large amount of stool, asymmetrical ovaries L>R.  Pt refused pain medication, was able to tolerate PO's.  Symptoms may be because of constipation.  Unable to evaluate ovary with US unavailable, bimanual exam which was non-tender, was used initially to decide imaging, so pelvic US was not ordered.  I discussed this with the pt and she requests to go home.  She was encouraged to return if not improving with zofran/miralax, and she is comfortable with this plan and still wishes to go home.  Asymmetry of ovaries on scan was reviewed with her, she will seek OB/GYN and/or PCP follow up.  [facility], also explained [facility] have US available.  Urine negative, labs unremarkable, VSS, pt tolerating PO's.  Discharged home in improved condition.     Final Clinical Impressions(s) / ED Diagnoses   Final diagnoses:  Left lower quadrant pain  Constipation, unspecified constipation type  Non-intractable vomiting without nausea, vomiting of unspecified type   I personally performed the services described in this documentation, which was scribed in my presence. The recorded information has been reviewed and is accurate.   New Prescriptions Discharge Medication List as of 06/14/2016  7:42 PM    START taking these medications   Details  dicyclomine (BENTYL) 20 MG tablet Take 1 tablet (20 mg total) by mouth 2 (two) times daily., Starting Thu 06/14/2016, Print    ondansetron (ZOFRAN) 4 MG tablet Take 1 tablet (4 mg total) by mouth every 8 (eight) hours as needed  for nausea or vomiting., Starting Thu 06/14/2016, Print    oxyCODONE-acetaminophen (PERCOCET) 5-325 MG tablet Take 1-2 tablets by mouth every 8 (eight) hours as needed., Starting Thu 06/14/2016, Print    polyethylene glycol powder (GLYCOLAX/MIRALAX) powder Take 17 g by mouth daily., Starting Thu 06/14/2016, Print         Danelle BerryLeisa Aldine Chakraborty, PA-C 06/18/16 [id number]    Vanetta MuldersScott Zackowski, MD 06/21/16 [id number]

## 2016-06-14 NOTE — ED Notes (Signed)
Pa  at bedside. 

## 2016-11-22 ENCOUNTER — Emergency Department (HOSPITAL_BASED_OUTPATIENT_CLINIC_OR_DEPARTMENT_OTHER)
Admission: EM | Admit: 2016-11-22 | Discharge: 2016-11-22 | Disposition: A | Discharge status: Home / Self Care | Payer: 59 | Attending: Emergency Medicine | Admitting: Emergency Medicine

## 2016-11-22 ENCOUNTER — Encounter (HOSPITAL_BASED_OUTPATIENT_CLINIC_OR_DEPARTMENT_OTHER): Payer: Self-pay | Admitting: *Deleted

## 2016-11-22 DIAGNOSIS — R69 Illness, unspecified: Secondary | ICD-10-CM

## 2016-11-22 DIAGNOSIS — F17200 Nicotine dependence, unspecified, uncomplicated: Secondary | ICD-10-CM | POA: Insufficient documentation

## 2016-11-22 DIAGNOSIS — R51 Headache: Secondary | ICD-10-CM | POA: Diagnosis not present

## 2016-11-22 DIAGNOSIS — R42 Dizziness and giddiness: Secondary | ICD-10-CM | POA: Diagnosis not present

## 2016-11-22 DIAGNOSIS — J111 Influenza due to unidentified influenza virus with other respiratory manifestations: Secondary | ICD-10-CM

## 2016-11-22 LAB — CBC
HCT: 37 % (ref 36.0–46.0)
Hemoglobin: 12 g/dL (ref 12.0–15.0)
MCH: 30.9 pg (ref 26.0–34.0)
MCHC: 32.4 g/dL (ref 30.0–36.0)
MCV: 95.4 fL (ref 78.0–100.0)
PLATELETS: 170 10*3/uL (ref 150–400)
RBC: 3.88 MIL/uL (ref 3.87–5.11)
RDW: 12.8 % (ref 11.5–15.5)
WBC: 6.5 10*3/uL (ref 4.0–10.5)

## 2016-11-22 LAB — BASIC METABOLIC PANEL
Anion gap: 7 (ref 5–15)
BUN: 8 mg/dL (ref 6–20)
CALCIUM: 9.1 mg/dL (ref 8.9–10.3)
CO2: 27 mmol/L (ref 22–32)
CREATININE: 0.78 mg/dL (ref 0.44–1.00)
Chloride: 103 mmol/L (ref 101–111)
GFR calc Af Amer: >60 mL/min (ref >60)
GLUCOSE: 94 mg/dL (ref 65–99)
POTASSIUM: 3.8 mmol/L (ref 3.5–5.1)
SODIUM: 137 mmol/L (ref 135–145)

## 2016-11-22 LAB — PREGNANCY, URINE: PREG TEST UR: NEGATIVE

## 2016-11-22 MED ORDER — OSELTAMIVIR PHOSPHATE 75 MG PO CAPS: 75.0000 mg | ORAL_CAPSULE | Freq: Two times a day (BID) | ORAL | 0 refills | Status: DC

## 2016-11-22 NOTE — ED Notes (Signed)
Patient denies pain and is resting comfortably.  

## 2016-11-22 NOTE — ED Provider Notes (Signed)
[facility]   CSN: [id number] Arrival date & time: 11/22/16  1016     History   Chief Complaint Chief Complaint  Patient presents with  . Dizziness    HPI Melinda Mccoy is a 31 y.o. female.  HPI  Pt presenting with c/o feeling lighteheaded.  Pt states she started getting sick 3 days ago, had sore throat, nasal congestion, fever.  Today she went back to work- has ongoing nasal congestion but no further fevers.  Today she has had a few episodes of feeling lightheaded like she may faint.  No significant cough or shortness of breath.  Has been drinking liquids, no vomiting or diarrhea.  LMP was 2 weeks ago, no hx of anemia that she knows of.  She did not get her flu immunization this year.  No specific sick contacts.  There are no other associated systemic symptoms, there are no other alleviating or modifying factors.   Past Medical History:  Diagnosis Date  . No pertinent past medical history     There are no active problems to display for this patient.   Past Surgical History:  Procedure Laterality Date  . laproscopy    . OVARIAN CYST DRAINAGE    . OVARIAN CYST DRAINAGE      OB History    Gravida Para Term Preterm AB Living   3 1 1  0 2 1   SAB TAB Ectopic Multiple Live Births   2 0 0 0         Home Medications    Prior to Admission medications   Medication Sig Start Date End Date Taking? Authorizing Provider  oseltamivir (TAMIFLU) 75 MG capsule Take 1 capsule (75 mg total) by mouth every 12 (twelve) hours. 11/22/16   Jerelyn Scott, MD    Family History History reviewed. No pertinent family history.  Social History Social History  Substance Use Topics  . Smoking status: Current Every Day Smoker    Packs/day: 0.50  . Smokeless tobacco: Never Used  . Alcohol use No     Allergies   Amoxicillin and Prednisone   Review of Systems Review of Systems  ROS reviewed and all otherwise negative except for mentioned in  HPI   Physical Exam Updated Vital Signs BP 122/78 (BP Location: Right Arm)   Pulse 67   Temp 98.4 F (36.9 C) (Oral)   Resp 20   Ht 5\' 4"  (1.626 m)   Wt 140 lb (63.5 kg)   LMP 11/08/2016   SpO2 100%   BMI 24.03 kg/m  Vitals reviewed Physical Exam Physical Examination: General appearance - alert, well appearing, and in no distress Mental status - alert, oriented to person, place, and time Eyes - no conjunctival injection, no scleral icterus Mouth - mucous membranes moist, pharynx normal without lesions Neck - supple, no significant adenopathy Chest - clear to auscultation, no wheezes, rales or rhonchi, symmetric air entry Heart - normal rate, regular rhythm, normal S1, S2, no murmurs, rubs, clicks or gallops Abdomen - soft, nontender, nondistended, no masses or organomegaly Neurological - alert, oriented, normal speech Extremities - peripheral pulses normal, no pedal edema, no clubbing or cyanosis Skin - normal coloration and turgor, no rashes  ED Treatments / Results  Labs (all labs ordered are listed, but only abnormal results are displayed) Labs Reviewed  CBC  BASIC METABOLIC PANEL  PREGNANCY, URINE    EKG  EKG Interpretation None       Radiology No results found.  Procedures Procedures (including critical care time)  Medications Ordered in ED Medications - No data to display   Initial Impression / Assessment and Plan / ED Course  I have reviewed the triage vital signs and the nursing notes.  Pertinent labs & imaging results that were available during my care of the patient were reviewed by me and considered in my medical decision making (see chart for details).     Pt presenting with fever, body aches, near fainting.  Her exam is normal.  Will start on tamiflu due to concern for influenza.  Discharged with strict return precautions.  Pt agreeable with plan.  Final Clinical Impressions(s) / ED Diagnoses   Final diagnoses:  Influenza-like illness     New Prescriptions Discharge Medication List as of 11/22/2016 12:11 PM    START taking these medications   Details  oseltamivir (TAMIFLU) 75 MG capsule Take 1 capsule (75 mg total) by mouth every 12 (twelve) hours., Starting Thu 11/22/2016, Print         Jerelyn ScottMartha Linker, MD 11/27/16 2112

## 2016-11-22 NOTE — ED Triage Notes (Signed)
Pt reports congestion and sinus pain x this weekend, yesterday she started having "dizzy spells", states she gets "cold chills" and then feels light headed.

## 2016-11-22 NOTE — Discharge Instructions (Signed)
Return to the ED with any concerns including diffifuclty breathing, vomiting and not able to keep down liquids or medications, fainting, decreased level of alertness/lethargy, or any other alarming symptoms

## 2017-01-13 ENCOUNTER — Encounter (HOSPITAL_COMMUNITY): Payer: Self-pay | Admitting: Emergency Medicine

## 2017-01-13 ENCOUNTER — Emergency Department (HOSPITAL_COMMUNITY)
Admission: EM | Admit: 2017-01-13 | Discharge: 2017-01-13 | Disposition: A | Discharge status: Home / Self Care | Payer: 59 | Attending: Emergency Medicine | Admitting: Emergency Medicine

## 2017-01-13 ENCOUNTER — Emergency Department (HOSPITAL_COMMUNITY): Payer: 59

## 2017-01-13 DIAGNOSIS — G43919 Migraine, unspecified, intractable, without status migrainosus: Secondary | ICD-10-CM | POA: Insufficient documentation

## 2017-01-13 DIAGNOSIS — F17290 Nicotine dependence, other tobacco product, uncomplicated: Secondary | ICD-10-CM | POA: Insufficient documentation

## 2017-01-13 DIAGNOSIS — R51 Headache: Secondary | ICD-10-CM | POA: Diagnosis not present

## 2017-01-13 DIAGNOSIS — G43011 Migraine without aura, intractable, with status migrainosus: Secondary | ICD-10-CM | POA: Diagnosis not present

## 2017-01-13 LAB — COMPREHENSIVE METABOLIC PANEL
ALT: 12 U/L — AB (ref 14–54)
ANION GAP: 8 (ref 5–15)
AST: 21 U/L (ref 15–41)
Albumin: 4.6 g/dL (ref 3.5–5.0)
Alkaline Phosphatase: 52 U/L (ref 38–126)
BUN: 9 mg/dL (ref 6–20)
CO2: 23 mmol/L (ref 22–32)
Calcium: 9.9 mg/dL (ref 8.9–10.3)
Chloride: 106 mmol/L (ref 101–111)
Creatinine, Ser: 0.75 mg/dL (ref 0.44–1.00)
GFR calc non Af Amer: >60 mL/min (ref >60)
Glucose, Bld: 101 mg/dL — ABNORMAL HIGH (ref 65–99)
POTASSIUM: 3.9 mmol/L (ref 3.5–5.1)
SODIUM: 137 mmol/L (ref 135–145)
Total Bilirubin: 0.6 mg/dL (ref 0.3–1.2)
Total Protein: 7.6 g/dL (ref 6.5–8.1)

## 2017-01-13 LAB — CBC WITH DIFFERENTIAL/PLATELET
BASOS PCT: 0 %
Basophils Absolute: 0 10*3/uL (ref 0.0–0.1)
EOS PCT: 1 %
Eosinophils Absolute: 0 10*3/uL (ref 0.0–0.7)
HEMATOCRIT: 38.2 % (ref 36.0–46.0)
Hemoglobin: 12.4 g/dL (ref 12.0–15.0)
LYMPHS ABS: 1.4 10*3/uL (ref 0.7–4.0)
LYMPHS PCT: 23 %
MCH: 30.4 pg (ref 26.0–34.0)
MCHC: 32.5 g/dL (ref 30.0–36.0)
MCV: 93.6 fL (ref 78.0–100.0)
MONO ABS: 0.3 10*3/uL (ref 0.1–1.0)
MONOS PCT: 4 %
NEUTROS ABS: 4.4 10*3/uL (ref 1.7–7.7)
NEUTROS PCT: 72 %
PLATELETS: 228 10*3/uL (ref 150–400)
RBC: 4.08 MIL/uL (ref 3.87–5.11)
RDW: 13.8 % (ref 11.5–15.5)
WBC: 6.1 10*3/uL (ref 4.0–10.5)

## 2017-01-13 MED ORDER — METOCLOPRAMIDE HCL 5 MG/ML IJ SOLN
10.0000 mg | Freq: Once | INTRAMUSCULAR | Status: AC
  Administered 2017-01-13: 10 mg via INTRAVENOUS
  Filled 2017-01-13: qty 2

## 2017-01-13 MED ORDER — TETRACAINE HCL 0.5 % OP SOLN
1.0000 [drp] | Freq: Once | OPHTHALMIC | Status: AC
  Administered 2017-01-13: 1 [drp] via OPHTHALMIC
  Filled 2017-01-13: qty 2

## 2017-01-13 MED ORDER — BUTALBITAL-APAP-CAFFEINE 50-325-40 MG PO TABS: 1.0000 | ORAL_TABLET | Freq: Four times a day (QID) | ORAL | 0 refills | Status: AC | PRN

## 2017-01-13 MED ORDER — SODIUM CHLORIDE 0.9 % IV BOLUS (SEPSIS)
1000.0000 mL | Freq: Once | INTRAVENOUS | Status: AC
  Administered 2017-01-13: 1000 mL via INTRAVENOUS

## 2017-01-13 MED ORDER — KETOROLAC TROMETHAMINE 30 MG/ML IJ SOLN
30.0000 mg | Freq: Once | INTRAMUSCULAR | Status: AC
  Administered 2017-01-13: 30 mg via INTRAVENOUS
  Filled 2017-01-13: qty 1

## 2017-01-13 NOTE — Discharge Instructions (Signed)
If headache returns try taking 3 Ibuprofen (600mg ), 2 benadryl (50mg ), 8oz of caffeine, and 8oz of water and take a nap

## 2017-01-13 NOTE — ED Notes (Signed)
Patient transported to CT 

## 2017-01-13 NOTE — ED Provider Notes (Signed)
[facility] Provider Note   CSN: [id number] Arrival date & time: 01/13/17  1549  By signing my name below, I, [name], attest that this documentation has been prepared under the direction and in the presence of Gwyneth Sprout, MD. Electronically Signed: [name], ED Scribe. 01/13/17. 4:54 PM.     History   Chief Complaint Chief Complaint  Patient presents with  . Headache  . Blurred Vision    HPI Melinda Mccoy is a 31 y.o. female who presents to the Emergency Department complaining of moderate, gradually worsening, waxing and waning left-sided HA localized to the ear, cheek and eye for 3 days with associated nausea, mild right eye blurred vision. Pt reports h/o occasional occipital migraines, but less severe. She has tried [employer], Tylenol, Excedrine with no relief of pain. She reports her pain is mildly relieved with a cool rag on her neck and worsened with light. She states the hearing in her left ear is mildly "cloudy" but denies substantial hearing loss. She does not wear glasses or contacts. She denies congestion, rhinorrhea, neck pain, unilateral numbness or tingling.      The history is provided by the patient. No language interpreter was used.    Past Medical History:  Diagnosis Date  . No pertinent past medical history     There are no active problems to display for this patient.   Past Surgical History:  Procedure Laterality Date  . laproscopy    . OVARIAN CYST DRAINAGE    . OVARIAN CYST DRAINAGE      OB History    Gravida Para Term Preterm AB Living   3 1 1  0 2 1   SAB TAB Ectopic Multiple Live Births   2 0 0 0         Home Medications    Prior to Admission medications   Medication Sig Start Date End Date Taking? Authorizing Provider  oseltamivir (TAMIFLU) 75 MG capsule Take 1 capsule (75 mg total) by mouth every 12 (twelve) hours. 11/22/16   Jerelyn Scott, MD    Family History No family history on file.  Social  History Social History  Substance Use Topics  . Smoking status: Current Every Day Smoker    Packs/day: 0.50    Types: E-cigarettes  . Smokeless tobacco: Never Used     Comment: E cigarettes daily --   . Alcohol use No     Allergies   Amoxicillin and Prednisone   Review of Systems Review of Systems  HENT: Negative for congestion, hearing loss and rhinorrhea.   Eyes: Positive for photophobia and visual disturbance.  Musculoskeletal: Negative for neck pain.  Neurological: Positive for headaches. Negative for numbness.  All other systems reviewed and are negative.   Physical Exam Updated Vital Signs BP 138/80 (BP Location: Left Arm)   Pulse (!) 103   Temp 98.1 F (36.7 C) (Oral)   Resp 20   Ht 5\' 4"  (1.626 m)   Wt 150 lb (68 kg)   LMP 01/13/2017   SpO2 98%   BMI 25.75 kg/m   Physical Exam  Constitutional: She is oriented to person, place, and time. She appears well-developed and well-nourished.  HENT:  Head: Normocephalic.  Eyes: Conjunctivae are normal. Pupils are equal, round, and reactive to light.  On fundoscopic exam no papilledema bilaterally. Pupils are 4 mm and briskly reactive bilaterally. IOP 15 in the left eye.     Visual Acuity  Right Eye Near: R Near: 20/20 Left  Eye Near:  L Near: 20/30 Bilateral Near:  20/20   Cardiovascular: Normal rate.   Pulmonary/Chest: Effort normal. No respiratory distress.  Abdominal: She exhibits no distension.  Musculoskeletal: Normal range of motion.  Neurological: She is alert and oriented to person, place, and time. No cranial nerve deficit or sensory deficit. Coordination normal.  No facial droop. 5/5 strength in upper and lower extremities bilaterally. Normal gait. Sensation within normal limits in upper and lower extremities.   Skin: Skin is warm and dry.  Psychiatric: She has a normal mood and affect. Her behavior is normal.  Nursing note and vitals reviewed.    ED Treatments / Results   DIAGNOSTIC  STUDIES: Oxygen Saturation is 98% on RA, normal by my interpretation.    COORDINATION OF CARE: 4:51 PM Discussed treatment plan with pt at bedside which includes labs, CT head and pt agreed to plan.    Labs (all labs ordered are listed, but only abnormal results are displayed) Labs Reviewed  COMPREHENSIVE METABOLIC PANEL - Abnormal; Notable for the following:       Result Value   Glucose, Bld 101 (*)    ALT 12 (*)    All other components within normal limits  CBC WITH DIFFERENTIAL/PLATELET    EKG  EKG Interpretation None       Radiology Ct Head Wo Contrast  Result Date: 01/13/2017 CLINICAL DATA:  Headache for 3 days. EXAM: CT HEAD WITHOUT CONTRAST TECHNIQUE: Contiguous axial images were obtained from the base of the skull through the vertex without intravenous contrast. COMPARISON:  03/20/2010 FINDINGS: Brain: No evidence of acute infarction, hemorrhage, hydrocephalus, extra-axial collection or mass lesion/mass effect. Vascular: No hyperdense vessel or unexpected calcification. Skull: Normal. Negative for fracture or focal lesion. Sinuses/Orbits: No acute finding. Other: None. IMPRESSION: No acute intracranial abnormality. Electronically Signed   By: Ted Mcalpineobrinka  Dimitrova M.D.   On: 01/13/2017 18:07    Procedures Procedures (including critical care time)  Medications Ordered in ED Medications - No data to display   Initial Impression / Assessment and Plan / ED Course  I have reviewed the triage vital signs and the nursing notes.  Pertinent labs & imaging results that were available during my care of the patient were reviewed by me and considered in my medical decision making (see chart for details).     Pt with sx most suggestive of migraine HA without sx suggestive of SAH(sudden onset, worst of life, or deficits), infection, or cavernous vein thrombosis.  Pt denies any sinus sx and no signs of zoster or lesions in the ear.  No neck pain or concern for dissection. Normal  neuro exam and vital signs. Will give HA cocktail.  IOP is 15 and no papilledema on fundoscopic exam.  6:30 PM CT neg. Pt feeling better and was d/ced home.  Final Clinical Impressions(s) / ED Diagnoses   Final diagnoses:  Intractable migraine without aura and with status migrainosus    New Prescriptions New Prescriptions   BUTALBITAL-ACETAMINOPHEN-CAFFEINE (FIORICET, ESGIC) 50-325-40 MG TABLET    Take 1-2 tablets by mouth every 6 (six) hours as needed for headache.    I personally performed the services described in this documentation, which was scribed in my presence.  The recorded information has been reviewed and considered.    Gwyneth SproutWhitney Adelayde Minney, MD 01/13/17 [phone]

## 2017-01-13 NOTE — ED Triage Notes (Signed)
Pt drove self to ED with c/o pain on left side of head/face, with blurry vision in left eye. Started 3 days ago

## 2017-04-22 DIAGNOSIS — R072 Precordial pain: Secondary | ICD-10-CM | POA: Diagnosis not present

## 2017-04-22 DIAGNOSIS — I209 Angina pectoris, unspecified: Secondary | ICD-10-CM | POA: Diagnosis not present

## 2017-04-22 DIAGNOSIS — R0602 Shortness of breath: Secondary | ICD-10-CM | POA: Diagnosis not present

## 2017-04-24 DIAGNOSIS — I209 Angina pectoris, unspecified: Secondary | ICD-10-CM | POA: Diagnosis not present

## 2017-05-06 DIAGNOSIS — R002 Palpitations: Secondary | ICD-10-CM | POA: Diagnosis not present

## 2017-05-06 DIAGNOSIS — I209 Angina pectoris, unspecified: Secondary | ICD-10-CM | POA: Diagnosis not present

## 2017-07-04 DIAGNOSIS — R002 Palpitations: Secondary | ICD-10-CM | POA: Diagnosis not present

## 2017-07-22 DIAGNOSIS — I209 Angina pectoris, unspecified: Secondary | ICD-10-CM | POA: Diagnosis not present

## 2017-07-22 DIAGNOSIS — R002 Palpitations: Secondary | ICD-10-CM | POA: Diagnosis not present

## 2017-09-16 DIAGNOSIS — R0602 Shortness of breath: Secondary | ICD-10-CM | POA: Diagnosis not present

## 2017-09-16 DIAGNOSIS — Z79899 Other long term (current) drug therapy: Secondary | ICD-10-CM | POA: Diagnosis not present

## 2017-09-16 DIAGNOSIS — R079 Chest pain, unspecified: Secondary | ICD-10-CM | POA: Diagnosis not present

## 2017-09-24 DIAGNOSIS — J069 Acute upper respiratory infection, unspecified: Secondary | ICD-10-CM | POA: Diagnosis not present

## 2020-06-18 ENCOUNTER — Ambulatory Visit
Admission: EM | Admit: 2020-06-18 | Discharge: 2020-06-18 | Disposition: A | Discharge status: Home / Self Care | Payer: Commercial Managed Care - PPO | Attending: Physician Assistant | Admitting: Physician Assistant

## 2020-06-18 DIAGNOSIS — M25562 Pain in left knee: Secondary | ICD-10-CM

## 2020-06-18 NOTE — ED Provider Notes (Signed)
[facility]    CSN: [id number] Arrival date & time: 06/18/20  1306      History   Chief Complaint Chief Complaint  Patient presents with  . Knee Injury    HPI [place] A Duecker is a 34 y.o. female.   34 year old female comes in for left knee pain after injury. Knee twisting during full, which then hit a wooden crate. Felt a popping sensation when this happened. Since then, has had trouble weight bearing, and has feeling of knee giving out. Ice compress, topical medicine without significant relief.      Past Medical History:  Diagnosis Date  . No pertinent past medical history     There are no problems to display for this patient.   Past Surgical History:  Procedure Laterality Date  . laproscopy    . OVARIAN CYST DRAINAGE    . OVARIAN CYST DRAINAGE      OB History    Gravida  3   Para  1   Term  1   Preterm  0   AB  2   Living  1     SAB  2   TAB  0   Ectopic  0   Multiple  0   Live Births               Home Medications    Prior to Admission medications   Not on File    Family History Family History  Problem Relation Age of Onset  . Healthy Mother     Social History Social History   Tobacco Use  . Smoking status: Current Every Day Smoker    Packs/day: 0.50    Types: E-cigarettes  . Smokeless tobacco: Never Used  . Tobacco comment: E cigarettes daily --   Substance Use Topics  . Alcohol use: No  . Drug use: No     Allergies   Amoxicillin and Prednisone   Review of Systems Review of Systems  Reason unable to perform ROS: See HPI as above.     Physical Exam Triage Vital Signs ED Triage Vitals  Enc Vitals Group     BP 06/18/20 1408 133/86     Pulse Rate 06/18/20 1408 76     Resp 06/18/20 1408 16     Temp 06/18/20 1408 98.1 F (36.7 C)     Temp Source 06/18/20 1408 Oral     SpO2 06/18/20 1408 96 %     Weight --      Height --      Head Circumference --      Peak Flow --      Pain Score  06/18/20 1422 7     Pain Loc --      Pain Edu? --      Excl. in GC? --    No data found.  Updated Vital Signs BP 133/86 (BP Location: Left Arm)   Pulse 76   Temp 98.1 F (36.7 C) (Oral)   Resp 16   LMP 06/04/2020 (Approximate)   SpO2 96%   Physical Exam Constitutional:      General: She is not in acute distress.    Appearance: Normal appearance. She is well-developed. She is not toxic-appearing or diaphoretic.  HENT:     Head: Normocephalic and atraumatic.  Eyes:     Conjunctiva/sclera: Conjunctivae normal.     Pupils: Pupils are equal, round, and reactive to light.  Pulmonary:     Effort: Pulmonary effort  is normal. No respiratory distress.  Musculoskeletal:     Cervical back: Normal range of motion and neck supple.     Comments: Abrasion along left lateral knee joint line. No bleeding, erythema, warmth. No significant swelling, contusion. Diffuse tenderness along left lateral joint line. Decreased ROM with trouble flexing and extending on own. NVI  Skin:    General: Skin is warm and dry.  Neurological:     Mental Status: She is alert and oriented to person, place, and time.      UC Treatments / Results  Labs (all labs ordered are listed, but only abnormal results are displayed) Labs Reviewed - No data to display  EKG   Radiology No results found.  Procedures Procedures (including critical care time)  Medications Ordered in UC Medications - No data to display  Initial Impression / Assessment and Plan / UC Course  I have reviewed the triage vital signs and the nursing notes.  Pertinent labs & imaging results that were available during my care of the patient were reviewed by me and considered in my medical decision making (see chart for details).    Discussed worrisome for meniscus/ligament injury.  Currently unable to tolerate further testing maneuver.  Will provide knee brace, crutches for symptomatic management.  Continue NSAIDs, ice compress, rest.  To  follow-up with sports medicine/orthopedic for further evaluation and management needed.  Return precautions given.  Final Clinical Impressions(s) / UC Diagnoses   Final diagnoses:  Acute pain of left knee   ED Prescriptions    None     PDMP not reviewed this encounter.   Belinda Fisher, PA-C 06/18/20 1501

## 2020-06-18 NOTE — ED Triage Notes (Signed)
Pt presents with left knee twist injury that occurred yesterday after she tripped and fell.

## 2020-06-18 NOTE — Discharge Instructions (Signed)
Left knee injury worrisome for meniscus/ligament injury. Knee brace at this time. Ibuprofen 800mg  three times a day. Continue ice compress, elevation. Crutches as needed. Follow up with sports medicine/orthopedics for further evaluation.

## 2020-07-17 ENCOUNTER — Ambulatory Visit
Admission: RE | Admit: 2020-07-17 | Discharge: 2020-07-17 | Disposition: A | Discharge status: Home / Self Care | Payer: Commercial Managed Care - PPO | Source: Ambulatory Visit | Attending: Physician Assistant | Admitting: Physician Assistant

## 2020-07-17 ENCOUNTER — Other Ambulatory Visit: Payer: Self-pay

## 2020-07-17 VITALS — BP 134/91 | HR 94 | Temp 98.2°F | Resp 16

## 2020-07-17 DIAGNOSIS — Z20822 Contact with and (suspected) exposure to covid-19: Secondary | ICD-10-CM | POA: Diagnosis not present

## 2020-07-17 NOTE — Discharge Instructions (Signed)

## 2020-07-17 NOTE — ED Triage Notes (Signed)
Pt is here for nurse visit requesting COVID test for exposure.

## 2020-07-18 LAB — NOVEL CORONAVIRUS, NAA: SARS-CoV-2, NAA: NOT DETECTED

## 2020-07-18 LAB — SARS-COV-2, NAA 2 DAY TAT

## 2020-09-05 ENCOUNTER — Ambulatory Visit
Admission: EM | Admit: 2020-09-05 | Discharge: 2020-09-05 | Disposition: A | Discharge status: Home / Self Care | Payer: Commercial Managed Care - PPO | Attending: Emergency Medicine | Admitting: Emergency Medicine

## 2020-09-05 ENCOUNTER — Ambulatory Visit (INDEPENDENT_AMBULATORY_CARE_PROVIDER_SITE_OTHER): Payer: Commercial Managed Care - PPO

## 2020-09-05 DIAGNOSIS — M545 Low back pain, unspecified: Secondary | ICD-10-CM | POA: Diagnosis not present

## 2020-09-05 DIAGNOSIS — M5441 Lumbago with sciatica, right side: Secondary | ICD-10-CM

## 2020-09-05 MED ORDER — KETOROLAC TROMETHAMINE 30 MG/ML IJ SOLN
30.0000 mg | Freq: Once | INTRAMUSCULAR | Status: AC
  Administered 2020-09-05: 30 mg via INTRAMUSCULAR

## 2020-09-05 MED ORDER — KETOROLAC TROMETHAMINE 30 MG/ML IJ SOLN: 30.0000 mg | Freq: Once | INTRAMUSCULAR | Status: DC

## 2020-09-05 MED ORDER — CYCLOBENZAPRINE HCL 5 MG PO TABS: 5.0000 mg | ORAL_TABLET | Freq: Two times a day (BID) | ORAL | 0 refills | Status: AC | PRN

## 2020-09-05 NOTE — ED Triage Notes (Signed)
Pt c/o lower back pain radiating to both buttocks since Saturday. Denies injury.

## 2020-09-05 NOTE — ED Provider Notes (Signed)
[facility]    CSN: [id number] Arrival date & time: 09/05/20  1552      History   Chief Complaint Chief Complaint  Patient presents with  . Back Pain    HPI Melinda Mccoy is a 34 y.o. female  presents for low back pain.  States pain is achy, radiates to right glute.  Has tried apap without relief.  Denies trauma/injury to the affected area and does not recall an inciting event.  Denies fever, saddle area anesthesia, lower extremity numbness/weakness, urinary retention, fecal incontinence.  Past Medical History:  Diagnosis Date  . No pertinent past medical history     There are no problems to display for this patient.   Past Surgical History:  Procedure Laterality Date  . laproscopy    . OVARIAN CYST DRAINAGE    . OVARIAN CYST DRAINAGE      OB History    Gravida  3   Para  1   Term  1   Preterm  0   AB  2   Living  1     SAB  2   TAB  0   Ectopic  0   Multiple  0   Live Births               Home Medications    Prior to Admission medications   Medication Sig Start Date End Date Taking? Authorizing Provider  cyclobenzaprine (FLEXERIL) 5 MG tablet Take 1 tablet (5 mg total) by mouth 2 (two) times daily as needed for up to 7 days for muscle spasms. 09/05/20 09/12/20  Hall-Potvin, [place], PA-C    Family History Family History  Problem Relation Age of Onset  . Healthy Mother     Social History Social History   Tobacco Use  . Smoking status: Current Every Day Smoker    Packs/day: 0.50    Types: E-cigarettes  . Smokeless tobacco: Never Used  . Tobacco comment: E cigarettes daily --   Substance Use Topics  . Alcohol use: No  . Drug use: No     Allergies   Amoxicillin and Prednisone   Review of Systems Review of Systems  Constitutional: Negative for fatigue and fever.  HENT: Negative for ear pain, sinus pain, sore throat and voice change.   Eyes: Negative for pain, redness and visual disturbance.    Respiratory: Negative for cough and shortness of breath.   Cardiovascular: Negative for chest pain and palpitations.  Gastrointestinal: Negative for abdominal pain, diarrhea and vomiting.  Musculoskeletal: Positive for back pain. Negative for arthralgias and myalgias.  Skin: Negative for rash and wound.  Neurological: Negative for syncope and headaches.     Physical Exam Triage Vital Signs ED Triage Vitals  Enc Vitals Group     BP 09/05/20 1656 (!) 141/84     Pulse Rate 09/05/20 1656 75     Resp 09/05/20 1656 18     Temp 09/05/20 1656 98.1 F (36.7 C)     Temp Source 09/05/20 1656 Oral     SpO2 09/05/20 1656 97 %     Weight --      Height --      Head Circumference --      Peak Flow --      Pain Score 09/05/20 1657 7     Pain Loc --      Pain Edu? --      Excl. in GC? --    No data found.  Updated  Vital Signs BP (!) 141/84 (BP Location: Left Arm)   Pulse 75   Temp 98.1 F (36.7 C) (Oral)   Resp 18   LMP 09/04/2020   SpO2 97%   Visual Acuity Right Eye Distance:   Left Eye Distance:   Bilateral Distance:    Right Eye Near:   Left Eye Near:    Bilateral Near:     Physical Exam Constitutional:      General: She is not in acute distress. HENT:     Head: Normocephalic and atraumatic.  Eyes:     General: No scleral icterus.    Pupils: Pupils are equal, round, and reactive to light.  Cardiovascular:     Rate and Rhythm: Normal rate.  Pulmonary:     Effort: Pulmonary effort is normal.  Musculoskeletal:        General: Tenderness present. No swelling.     Comments: Lumbar spinous tenderness.  No PSIS ttp or deformity.  Exam limited to pt cooperation 2/2 pain  Skin:    Coloration: Skin is not jaundiced or pale.  Neurological:     General: No focal deficit present.     Mental Status: She is alert and oriented to person, place, and time.      UC Treatments / Results  Labs (all labs ordered are listed, but only abnormal results are displayed) Labs  Reviewed - No data to display  EKG   Radiology DG Lumbar Spine Complete  Result Date: 09/05/2020 CLINICAL DATA:  Low back pain for 2 days EXAM: LUMBAR SPINE - COMPLETE 4+ VIEW COMPARISON:  09/14/2005 FINDINGS: Frontal, bilateral oblique, and lateral views of the lumbar spine are obtained. There are 5 non-rib-bearing lumbar type vertebral bodies in anatomic alignment. No fractures. Moderate facet hypertrophy and mild spondylosis at the L5/S1 level. Sacroiliac joints are normal. Bowel gas pattern is unremarkable. Moderate stool throughout the colon. IMPRESSION: 1. Spondylosis and facet hypertrophy at L5/S1, progressive since prior study. 2. No acute bony abnormality. Electronically Signed   By: Sharlet Salina M.D.   On: 09/05/2020 18:13    Procedures Procedures (including critical care time)  Medications Ordered in UC Medications  ketorolac (TORADOL) 30 MG/ML injection 30 mg (30 mg Intramuscular Given 09/05/20 1811)    Initial Impression / Assessment and Plan / UC Course  I have reviewed the triage vital signs and the nursing notes.  Pertinent labs & imaging results that were available during my care of the patient were reviewed by me and considered in my medical decision making (see chart for details).     XR w/o acute pathology.  Toradol given, flexeril sent.  Will f/u w/ ortho prn.  Return precautions discussed, pt verbalized understanding and is agreeable to plan. Final Clinical Impressions(s) / UC Diagnoses   Final diagnoses:  Acute midline low back pain with right-sided sciatica     Discharge Instructions     RICE: rest, ice, compression, elevation as needed for pain.    Heat therapy (hot compress, warm wash rag, hot showers, etc.) can help relax muscles and soothe muscle aches. Cold therapy (ice packs) can be used to help swelling both after injury and after prolonged use of areas of chronic pain/aches.  Pain medication:  350 mg-1000 mg of Tylenol (acetaminophen) and/or  200 mg - 800 mg of Advil (ibuprofen, Motrin) every 8 hours as needed.  May alternate between the two throughout the day as they are generally safe to take together.  DO NOT exceed more than 3000  mg of Tylenol or 3200 mg of ibuprofen in a 24 hour period as this could damage your stomach, kidneys, liver, or increase your bleeding risk.  500 mg Naprosyn/Aleve (naproxen) every 12 hours with food:  AVOID other NSAIDs while taking this (may have Tylenol).  May take muscle relaxer as needed for severe pain / spasm.  (This medication may cause you to become tired so it is important you do not drink alcohol or operate heavy machinery while on this medication.  Recommend your first dose to be taken before bedtime to monitor for side effects safely)  Important to follow up with specialist(s) below for further evaluation/management if your symptoms persist or worsen.    ED Prescriptions    Medication Sig Dispense Auth. Provider   cyclobenzaprine (FLEXERIL) 5 MG tablet Take 1 tablet (5 mg total) by mouth 2 (two) times daily as needed for up to 7 days for muscle spasms. 14 tablet Hall-Potvin, [place], PA-C     PDMP not reviewed this encounter.   Hall-Potvin, Melinda, [place] 09/05/20 1825

## 2020-09-05 NOTE — Discharge Instructions (Signed)
RICE: rest, ice, compression, elevation as needed for pain.    Heat therapy (hot compress, warm wash rag, hot showers, etc.) can help relax muscles and soothe muscle aches. Cold therapy (ice packs) can be used to help swelling both after injury and after prolonged use of areas of chronic pain/aches.  Pain medication:  350 mg-1000 mg of Tylenol (acetaminophen) and/or 200 mg - 800 mg of Advil (ibuprofen, Motrin) every 8 hours as needed.  May alternate between the two throughout the day as they are generally safe to take together.  DO NOT exceed more than 3000 mg of Tylenol or 3200 mg of ibuprofen in a 24 hour period as this could damage your stomach, kidneys, liver, or increase your bleeding risk.  500 mg Naprosyn/Aleve (naproxen) every 12 hours with food:  AVOID other NSAIDs while taking this (may have Tylenol).  May take muscle relaxer as needed for severe pain / spasm.  (This medication may cause you to become tired so it is important you do not drink alcohol or operate heavy machinery while on this medication.  Recommend your first dose to be taken before bedtime to monitor for side effects safely)  Important to follow up with specialist(s) below for further evaluation/management if your symptoms persist or worsen. 

## 2021-04-23 ENCOUNTER — Ambulatory Visit
Admission: EM | Admit: 2021-04-23 | Discharge: 2021-04-23 | Disposition: A | Discharge status: Home / Self Care | Payer: Commercial Managed Care - PPO

## 2021-04-23 ENCOUNTER — Other Ambulatory Visit: Payer: Self-pay

## 2021-04-23 DIAGNOSIS — B001 Herpesviral vesicular dermatitis: Secondary | ICD-10-CM

## 2021-04-23 DIAGNOSIS — R220 Localized swelling, mass and lump, head: Secondary | ICD-10-CM | POA: Diagnosis not present

## 2021-04-23 MED ORDER — CLINDAMYCIN HCL 300 MG PO CAPS: 300.0000 mg | ORAL_CAPSULE | Freq: Three times a day (TID) | ORAL | 0 refills | Status: AC

## 2021-04-23 MED ORDER — VALACYCLOVIR HCL 1 G PO TABS: 1000.0000 mg | ORAL_TABLET | Freq: Two times a day (BID) | ORAL | 0 refills | Status: AC

## 2021-04-23 NOTE — Discharge Instructions (Addendum)
Begin Valtrex twice daily x1 week for cold sore, may continue topical over-the-counter medicine as well Clindamycin to cover for underlying infection and mouth/abscess Warm compresses Tylenol and ibuprofen Please follow-up if any symptoms not improving or worsening

## 2021-04-23 NOTE — ED Triage Notes (Signed)
Three day h/o fever blister on her right bottom lip and right sided facial swelling. Has been taking Ibuprofen with some relief.

## 2021-04-23 NOTE — ED Provider Notes (Signed)
[facility]    CSN: [id number] Arrival date & time: 04/23/21  1009      History   Chief Complaint Chief Complaint  Patient presents with   Mouth Lesions    Right bottom lip    HPI Melinda Mccoy is a 35 y.o. female presenting today for evaluation of facial swelling.  Reports developing cold sore to lower right lip with associated pain.  Has also developed swelling to her right lower jaw.  Denies associated dental problems in this area.  Felt slightly feverish.  Denies difficulty swallowing.  HPI  Past Medical History:  Diagnosis Date   No pertinent past medical history     There are no problems to display for this patient.   Past Surgical History:  Procedure Laterality Date   laproscopy     OVARIAN CYST DRAINAGE     OVARIAN CYST DRAINAGE      OB History     Gravida  3   Para  1   Term  1   Preterm  0   AB  2   Living  1      SAB  2   IAB  0   Ectopic  0   Multiple  0   Live Births               Home Medications    Prior to Admission medications   Medication Sig Start Date End Date Taking? Authorizing Provider  clindamycin (CLEOCIN) 300 MG capsule Take 1 capsule (300 mg total) by mouth 3 (three) times daily for 7 days. 04/23/21 04/30/21 Yes Anala Whisenant C, PA-C  valACYclovir (VALTREX) 1000 MG tablet Take 1 tablet (1,000 mg total) by mouth 2 (two) times daily for 7 days. 04/23/21 04/30/21 Yes Lillar Bianca C, PA-C  DOXYLAMINE SUCCINATE PO Take by mouth.    [provider]  ZUBSOLV 1.4-0.36 MG SUBL Place 1 tablet under the tongue 2 (two) times daily. 04/18/21   [provider]    Family History Family History  Problem Relation Age of Onset   Healthy Mother     Social History Social History   Tobacco Use   Smoking status: Every Day    Packs/day: 0.50    Pack years: 0.00    Types: Cigarettes   Smokeless tobacco: Never   Tobacco comments:    E cigarettes daily --   Vaping Use   Vaping Use: Never  used  Substance Use Topics   Alcohol use: No   Drug use: No     Allergies   Amoxicillin and Prednisone   Review of Systems Review of Systems  Constitutional:  Negative for activity change, appetite change, chills, fatigue and fever.  HENT:  Positive for facial swelling and mouth sores. Negative for congestion, ear pain, rhinorrhea, sinus pressure, sore throat and trouble swallowing.   Eyes:  Negative for discharge and redness.  Respiratory:  Negative for cough, chest tightness and shortness of breath.   Cardiovascular:  Negative for chest pain.  Gastrointestinal:  Negative for abdominal pain, diarrhea, nausea and vomiting.  Musculoskeletal:  Negative for myalgias.  Skin:  Negative for rash.  Neurological:  Negative for dizziness, light-headedness and headaches.    Physical Exam Triage Vital Signs ED Triage Vitals  Enc Vitals Group     BP      Pulse      Resp      Temp      Temp src  SpO2      Weight      Height      Head Circumference      Peak Flow      Pain Score      Pain Loc      Pain Edu?      Excl. in GC?    No data found.  Updated Vital Signs BP 130/88 (BP Location: Left Arm)   Pulse 71   Temp 97.9 F (36.6 C) (Oral)   Resp 18   SpO2 98%   Visual Acuity Right Eye Distance:   Left Eye Distance:   Bilateral Distance:    Right Eye Near:   Left Eye Near:    Bilateral Near:     Physical Exam Vitals and nursing note reviewed.  Constitutional:      Appearance: She is well-developed.     Comments: No acute distress  HENT:     Head: Normocephalic and atraumatic.     Comments: Right lower jaw with area of swelling with palpable induration, no overlying erythema or warmth, does not extend below submandibular line    Nose: Nose normal.     Mouth/Throat:     Comments: Lower lip with scabbing and crusting noted towards right side, mild associated erythema, no surrounding induration  No obvious gingival swelling or erythema noted to right lower  jaw Eyes:     Conjunctiva/sclera: Conjunctivae normal.  Cardiovascular:     Rate and Rhythm: Normal rate.  Pulmonary:     Effort: Pulmonary effort is normal. No respiratory distress.  Abdominal:     General: There is no distension.  Musculoskeletal:        General: Normal range of motion.     Cervical back: Neck supple.  Skin:    General: Skin is warm and dry.  Neurological:     Mental Status: She is alert and oriented to person, place, and time.     UC Treatments / Results  Labs (all labs ordered are listed, but only abnormal results are displayed) Labs Reviewed - No data to display  EKG   Radiology No results found.  Procedures Procedures (including critical care time)  Medications Ordered in UC Medications - No data to display  Initial Impression / Assessment and Plan / UC Course  I have reviewed the triage vital signs and the nursing notes.  Pertinent labs & imaging results that were available during my care of the patient were reviewed by me and considered in my medical decision making (see chart for details).     Cold sore/fever blister area-we will provide Valtrex to use, associated facial swelling on the same side, more consistent with possible developing dental abscess, covering with clindamycin, seems less likely lymphadenopathy, will continue to monitor, warm compresses Discussed strict return precautions. Patient verbalized understanding and is agreeable with plan.  Final Clinical Impressions(s) / UC Diagnoses   Final diagnoses:  Cold sore  Facial swelling     Discharge Instructions      Begin Valtrex twice daily x1 week for cold sore, may continue topical over-the-counter medicine as well Clindamycin to cover for underlying infection and mouth/abscess Warm compresses Tylenol and ibuprofen Please follow-up if any symptoms not improving or worsening     ED Prescriptions     Medication Sig Dispense Auth. Provider   valACYclovir (VALTREX)  1000 MG tablet Take 1 tablet (1,000 mg total) by mouth 2 (two) times daily for 7 days. 14 tablet Brilynn Biasi, [place] C, PA-C  clindamycin (CLEOCIN) 300 MG capsule Take 1 capsule (300 mg total) by mouth 3 (three) times daily for 7 days. 21 capsule Antonyo Hinderer, [place] C, PA-C      PDMP not reviewed this encounter.   Loys Shugars, [place] C, PA-C 04/23/21 1225
